# Patient Record
Sex: Female | Born: 1965 | Race: White | Hispanic: No | Marital: Married | Smoking: Never smoker
Health system: Southern US, Community
[De-identification: ages and names within clinical notes are randomized; demographics above are authoritative.]

## PROBLEM LIST (undated history)

## (undated) DIAGNOSIS — E7800 Pure hypercholesterolemia, unspecified: Secondary | ICD-10-CM

## (undated) DIAGNOSIS — K219 Gastro-esophageal reflux disease without esophagitis: Secondary | ICD-10-CM

## (undated) DIAGNOSIS — F329 Major depressive disorder, single episode, unspecified: Secondary | ICD-10-CM

## (undated) DIAGNOSIS — I10 Essential (primary) hypertension: Secondary | ICD-10-CM

## (undated) DIAGNOSIS — E039 Hypothyroidism, unspecified: Secondary | ICD-10-CM

## (undated) DIAGNOSIS — G43909 Migraine, unspecified, not intractable, without status migrainosus: Secondary | ICD-10-CM

## (undated) DIAGNOSIS — F32A Depression, unspecified: Secondary | ICD-10-CM

## (undated) DIAGNOSIS — J45909 Unspecified asthma, uncomplicated: Secondary | ICD-10-CM

## (undated) HISTORY — PX: CHOLECYSTECTOMY: SHX55

## (undated) HISTORY — DX: Hypothyroidism, unspecified: E03.9

## (undated) HISTORY — PX: PARTIAL HYSTERECTOMY: SHX80

## (undated) HISTORY — DX: Migraine, unspecified, not intractable, without status migrainosus: G43.909

## (undated) HISTORY — DX: Pure hypercholesterolemia, unspecified: E78.00

---
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
## 2013-09-14 HISTORY — PX: CATARACT EXTRACTION: SUR2

## 2016-10-15 HISTORY — PX: KNEE SURGERY: SHX244

## 2017-08-23 ENCOUNTER — Telehealth: Payer: Self-pay | Admitting: *Deleted

## 2017-08-23 NOTE — Telephone Encounter (Signed)
Called and spoke with the patient. I informed her that the office will be closed tomorrow 12/11 due to the snow and that we will call back ASAP to reschedule the patient. She verbalized understanding.

## 2017-08-24 ENCOUNTER — Ambulatory Visit: Payer: PRIVATE HEALTH INSURANCE | Admitting: Neurology

## 2017-08-25 NOTE — Telephone Encounter (Signed)
Unable to reach patient regarding an appt r/s

## 2017-08-26 NOTE — Telephone Encounter (Signed)
Tried to call to get patient r/s. LVM asking for call back.

## 2017-08-31 ENCOUNTER — Encounter: Payer: Self-pay | Admitting: Diagnostic Neuroimaging

## 2017-08-31 ENCOUNTER — Ambulatory Visit: Payer: PRIVATE HEALTH INSURANCE | Admitting: Diagnostic Neuroimaging

## 2017-08-31 VITALS — BP 134/86 | HR 84 | Ht 65.0 in | Wt 240.0 lb

## 2017-08-31 DIAGNOSIS — I6782 Cerebral ischemia: Secondary | ICD-10-CM | POA: Diagnosis not present

## 2017-08-31 DIAGNOSIS — G43109 Migraine with aura, not intractable, without status migrainosus: Secondary | ICD-10-CM | POA: Diagnosis not present

## 2017-08-31 MED ORDER — TOPIRAMATE 50 MG PO TABS: 50.0000 mg | ORAL_TABLET | Freq: Two times a day (BID) | ORAL | 12 refills | Status: DC

## 2017-08-31 MED ORDER — RIZATRIPTAN BENZOATE 10 MG PO TBDP: 10.0000 mg | ORAL_TABLET | ORAL | 11 refills | Status: DC | PRN

## 2017-08-31 NOTE — Telephone Encounter (Signed)
RN noted that pt had been r/s with Dr. Marjory LiesPenumalli 08/31/17.

## 2017-08-31 NOTE — Patient Instructions (Signed)
Thank you for coming to see [place] at [facility]. I hope we have been able to provide you high quality care today.  You may receive a patient satisfaction survey over the next few weeks. We would appreciate your feedback and comments so that we may continue to improve ourselves and the health of our patients.   MIGRAINE WITH AURA - start topiramate '50mg'$  at bedtime; after 1 week increase to twice a day; drink plenty of water - rizatriptan '10mg'$  as needed for breakthrough headache; may repeat x 1 after 2 hours; max 2 tabs per day or 8 per month - ok to use tylenol, ibuprofen or excedrin migraine as needed  Abnormal MRI brain (likely chronic small vessel ischemic disease) - continue crestor   ~~~~~~~~~~~~~~~~~~~~~~~~~~~~~~~~~~~~~~~~~~~~~~~~~~~~~~~~~~~~~~~~~  DR. Cierria Height'S GUIDE TO HAPPY AND HEALTHY LIVING These are some of my general health and wellness recommendations. Some of them may apply to you better than others. Please use common sense as you try these suggestions and feel free to ask me any questions.   ACTIVITY/FITNESS Mental, social, emotional and physical stimulation are very important for brain and body health. Try learning a new activity (arts, music, language, sports, games).  Keep moving your body to the best of your abilities. You can do this at home, inside or outside, the park, community center, gym or anywhere you like. Consider a physical therapist or personal trainer to get started. Consider the app Sworkit. Fitness trackers such as smart-watches, smart-phones or Fitbits can help as well.   NUTRITION Eat more plants: colorful vegetables, nuts, seeds and berries.  Eat less sugar, salt, preservatives and processed foods.  Avoid toxins such as cigarettes and alcohol.  Drink water when you are thirsty. Warm water with a slice of lemon is an excellent morning drink to start the day.  Consider these websites for more information The Nutrition Source  (https://www.henry-hernandez.biz/) Precision Nutrition (WindowBlog.ch)   RELAXATION Consider practicing mindfulness meditation or other relaxation techniques such as deep breathing, prayer, yoga, tai chi, massage. See website mindful.org or the apps Headspace or Calm to help get started.   SLEEP Try to get at least 7-8+ hours sleep per day. Regular exercise and reduced caffeine will help you sleep better. Practice good sleep hygeine techniques. See website sleep.org for more information.   PLANNING Prepare estate planning, living will, healthcare POA documents. Sometimes this is best planned with the help of an attorney. Theconversationproject.org and agingwithdignity.org are excellent resources.

## 2017-08-31 NOTE — Progress Notes (Signed)
[facility]  PATIENT: Dawn LabellaWanda Cunningham DOB: Apr 13, 1966  REFERRING CLINICIAN: R York, NP HISTORY FROM: patient and chart review REASON FOR VISIT: new consult    HISTORICAL  CHIEF COMPLAINT:  Chief Complaint  Patient presents with  . Headache    rm 7, New Pt, husband- Dawn Cunningham, "migraine HA, abnormal finding on brain MRI"    HISTORY OF PRESENT ILLNESS:   51 year old right-handed female here for evaluation of headaches.  For past 1-2 months patient has had onset of frontal, retro-orbital pressure aching headaches associated with photophobia, nausea, phonophobia.  Sometimes she sees floaters or flashing lights with her headaches.  Patient was diagnosed with possible migraine and given migraine rescue samples by PCP, which patient tried but made her sleepy.  Patient does not remember what medicine this was.  In triggering factors include increased stress levels or skipping meals.  Headaches occur 2-3 times a week and last 1-2 hours at a time.  No prior similar history of migraine.  No family history of migraine.  Due to new onset headache patient had MRI scan of the brain which was completed.  I reviewed imaging results.  MRI report mentions nonspecific T2 hyperintensities raising possibility of perinatal or prenatal insult versus multiple sclerosis.  I reviewed the images myself and do NOT think these represent demyelinating disease.   REVIEW OF SYSTEMS: Full 14 system review of systems performed and negative with exception of: Blurred vision headache insomnia depression not enough sleep decreased energy aching muscles allergies consented to be constipation.  ALLERGIES: Allergies  Allergen Reactions  . Erythromycin     intolerance  . Tetanus Toxoids     SOB, resp difficulty  . Penicillins Rash    HOME MEDICATIONS: Outpatient Medications Prior to Visit  Medication Sig Dispense Refill  . Calcium-Magnesium-Vitamin D (CALCIUM 1200+D3 PO) Take 2 capsules by mouth  daily.    . cyanocobalamin 1000 MCG tablet Take 1,000 mcg by mouth daily.    . DULoxetine (CYMBALTA) 60 MG capsule Take 120 mg by mouth daily.    Marland Kitchen. LEVOTHYROXINE SODIUM PO Take 37.5 mcg by mouth daily.    . niacin 500 MG tablet Take 500 mg by mouth at bedtime.    . Omega-3 Fatty Acids (FISH OIL PO) Take 1,200 mg by mouth daily.    . pantoprazole (PROTONIX) 40 MG tablet Take 40 mg by mouth daily.    . rosuvastatin (CRESTOR) 40 MG tablet Take 40 mg by mouth daily.     No facility-administered medications prior to visit.     PAST MEDICAL HISTORY: Past Medical History:  Diagnosis Date  . Hypercholesterolemia   . Hypothyroid   . Migraine     PAST SURGICAL HISTORY: Past Surgical History:  Procedure Laterality Date  . CATARACT EXTRACTION Bilateral 2015  . CHOLECYSTECTOMY    . KNEE SURGERY  10/2016   scope  . PARTIAL HYSTERECTOMY     age 51    FAMILY HISTORY: Family History  Problem Relation Age of Onset  . Cancer Mother        brain  . Atrial fibrillation Mother   . Hypertension Father   . Cancer Father        renal  . Other Father        enlarged heart  . Stroke Sister   . Hypertension Sister     SOCIAL HISTORY:  Social History   Socioeconomic History  . Marital status: Married    Spouse name: Not on file  . Number  of children: Not on file  . Years of education: Not on file  . Highest education level: Not on file  Social Needs  . Financial resource strain: Not on file  . Food insecurity - worry: Not on file  . Food insecurity - inability: Not on file  . Transportation needs - medical: Not on file  . Transportation needs - non-medical: Not on file  Occupational History  . Not on file  Tobacco Use  . Smoking status: Never Smoker  . Smokeless tobacco: Never Used  Substance and Sexual Activity  . Alcohol use: No    Frequency: Never  . Drug use: No  . Sexual activity: Not on file  Other Topics Concern  . Not on file  Social History Narrative   Lives  with husband   Caffeine - 1-2 weekly   2 children   RN , [employer]     PHYSICAL EXAM  GENERAL EXAM/CONSTITUTIONAL: Vitals:  Vitals:   08/31/17 1351  BP: 134/86  Pulse: 84  Weight: 240 lb (108.9 kg)  Height: 5\' 5"  (1.651 m)     Body mass index is 39.94 kg/m.  Visual Acuity Screening   Right eye Left eye Both eyes  Without correction: 20/30 20/40   With correction:        Patient is in no distress; well developed, nourished and groomed; neck is supple  CARDIOVASCULAR:  Examination of carotid arteries is normal; no carotid bruits  Regular rate and rhythm, no murmurs  Examination of peripheral vascular system by observation and palpation is normal  EYES:  Ophthalmoscopic exam of optic discs and posterior segments is normal; no papilledema or hemorrhages  MUSCULOSKELETAL:  Gait, strength, tone, movements noted in Neurologic exam below  NEUROLOGIC: MENTAL STATUS:  No flowsheet data found.  awake, alert, oriented to person, place and time  recent and remote memory intact  normal attention and concentration  language fluent, comprehension intact, naming intact,   fund of knowledge appropriate  CRANIAL NERVE:   2nd - no papilledema on fundoscopic exam  2nd, 3rd, 4th, 6th - pupils equal and reactive to light, visual fields full to confrontation, extraocular muscles intact, no nystagmus  5th - facial sensation symmetric  7th - facial strength symmetric  8th - hearing intact  9th - palate elevates symmetrically, uvula midline  11th - shoulder shrug symmetric  12th - tongue protrusion midline  MOTOR:   normal bulk and tone, full strength in the BUE, BLE  SENSORY:   normal and symmetric to light touch, temperature, vibration  COORDINATION:   finger-nose-finger, fine finger movements normal  REFLEXES:   deep tendon reflexes present and symmetric  GAIT/STATION:   narrow based gait; UNSTEADY TANDEM; LIMITED DUE TO LEFT KNEE  PAIN    DIAGNOSTIC DATA (LABS, IMAGING, TESTING) - I reviewed patient records, labs, notes, testing and imaging myself where available.  No results found for: WBC, HGB, HCT, MCV, PLT No results found for: NA, K, CL, CO2, GLUCOSE, BUN, CREATININE, CALCIUM, PROT, ALBUMIN, AST, ALT, ALKPHOS, BILITOT, GFRNONAA, GFRAA No results found for: CHOL, HDL, LDLCALC, LDLDIRECT, TRIG, CHOLHDL No results found for: ZOXW9U No results found for: VITAMINB12 No results found for: TSH   06/04/17 MRI brain [I reviewed images myself. -[facility]]  - no acute findings - non-specific T2 hyperintensities; likely chronic small vessel ischemic disease; not likely multiple sclerosis as indicated in  report    ASSESSMENT AND PLAN  51 y.o. year old female here with new onset headaches  with migraine features starting in October 2018.  Neurologic examination unremarkable.  MRI scan was reviewed, likely related to chronic small vessel ischemic disease and not related to her headaches.  I would recommend to continue with migraine treatment options.  Dx:  1. Migraine with aura and without status migrainosus, not intractable   2. Subcortical microvascular ischemic occlusive disease      PLAN:  MIGRAINE WITH AURA - start topiramate 50mg  at bedtime; after 1 week increase to twice a day; drink plenty of water - rizatriptan 10mg  as needed for breakthrough headache; may repeat x 1 after 2 hours; max 2 tabs per day or 8 per month  Abnormal MRI brain (likely chronic small vessel ischemic disease; not likely multiple sclerosis ) - continue crestor  Meds ordered this encounter  Medications  . topiramate (TOPAMAX) 50 MG tablet    Sig: Take 1 tablet (50 mg total) by mouth 2 (two) times daily.    Dispense:  60 tablet    Refill:  12  . rizatriptan (MAXALT-MLT) 10 MG disintegrating tablet    Sig: Take 1 tablet (10 mg total) by mouth as needed for migraine. May repeat in 2 hours if needed    Dispense:  9 tablet    Refill:   11   Return in about 6 months (around 03/01/2018) for with NP.    Suanne MarkerVIKRAM R. PENUMALLI, MD 08/31/2017, 2:19 PM Certified in Neurology, Neurophysiology and Neuroimaging  [facility] [address], [address] [place], [place] [address] [phone]

## 2018-03-02 ENCOUNTER — Ambulatory Visit: Payer: PRIVATE HEALTH INSURANCE | Admitting: Adult Health

## 2019-02-16 ENCOUNTER — Emergency Department (HOSPITAL_COMMUNITY): Payer: No Typology Code available for payment source

## 2019-02-16 ENCOUNTER — Encounter (HOSPITAL_COMMUNITY): Payer: Self-pay | Admitting: Emergency Medicine

## 2019-02-16 ENCOUNTER — Other Ambulatory Visit: Payer: Self-pay

## 2019-02-16 ENCOUNTER — Emergency Department (HOSPITAL_COMMUNITY)
Admission: EM | Admit: 2019-02-16 | Discharge: 2019-02-16 | Disposition: A | Discharge status: Home / Self Care | Payer: No Typology Code available for payment source | Attending: Emergency Medicine | Admitting: Emergency Medicine

## 2019-02-16 DIAGNOSIS — Y990 Civilian activity done for income or pay: Secondary | ICD-10-CM | POA: Insufficient documentation

## 2019-02-16 DIAGNOSIS — I10 Essential (primary) hypertension: Secondary | ICD-10-CM | POA: Insufficient documentation

## 2019-02-16 DIAGNOSIS — E039 Hypothyroidism, unspecified: Secondary | ICD-10-CM | POA: Insufficient documentation

## 2019-02-16 DIAGNOSIS — W1830XA Fall on same level, unspecified, initial encounter: Secondary | ICD-10-CM | POA: Insufficient documentation

## 2019-02-16 DIAGNOSIS — Z79899 Other long term (current) drug therapy: Secondary | ICD-10-CM | POA: Insufficient documentation

## 2019-02-16 DIAGNOSIS — S53104A Unspecified dislocation of right ulnohumeral joint, initial encounter: Secondary | ICD-10-CM | POA: Diagnosis not present

## 2019-02-16 DIAGNOSIS — S59901A Unspecified injury of right elbow, initial encounter: Secondary | ICD-10-CM | POA: Diagnosis present

## 2019-02-16 DIAGNOSIS — Y92129 Unspecified place in nursing home as the place of occurrence of the external cause: Secondary | ICD-10-CM | POA: Diagnosis not present

## 2019-02-16 DIAGNOSIS — Y9301 Activity, walking, marching and hiking: Secondary | ICD-10-CM | POA: Diagnosis not present

## 2019-02-16 HISTORY — DX: Essential (primary) hypertension: I10

## 2019-02-16 HISTORY — DX: Gastro-esophageal reflux disease without esophagitis: K21.9

## 2019-02-16 HISTORY — DX: Depression, unspecified: F32.A

## 2019-02-16 HISTORY — DX: Unspecified asthma, uncomplicated: J45.909

## 2019-02-16 MED ORDER — HYDROMORPHONE HCL 1 MG/ML IJ SOLN
0.5000 mg | Freq: Once | INTRAMUSCULAR | Status: AC
  Administered 2019-02-16: 0.5 mg via INTRAVENOUS
  Filled 2019-02-16: qty 1

## 2019-02-16 MED ORDER — HYDROCODONE-ACETAMINOPHEN 5-325 MG PO TABS: 1.0000 | ORAL_TABLET | Freq: Four times a day (QID) | ORAL | 0 refills | Status: DC | PRN

## 2019-02-16 MED ORDER — ETOMIDATE 2 MG/ML IV SOLN
INTRAVENOUS | Status: AC | PRN
  Administered 2019-02-16: 10 mg via INTRAVENOUS

## 2019-02-16 MED ORDER — ETOMIDATE 2 MG/ML IV SOLN
INTRAVENOUS | Status: AC | PRN
  Administered 2019-02-16: 5 mg via INTRAVENOUS

## 2019-02-16 MED ORDER — ETOMIDATE 2 MG/ML IV SOLN
10.0000 mg | Freq: Once | INTRAVENOUS | Status: AC
  Administered 2019-02-16: 21:00:00 10 mg via INTRAVENOUS
  Filled 2019-02-16: qty 10

## 2019-02-16 NOTE — ED Provider Notes (Signed)
[facility] Provider Note   CSN: [id number] Arrival date & time: 02/16/19  1734   History   Chief Complaint Chief Complaint  Patient presents with  . Arm Injury    HPI Cherlyn LabellaWanda Lewey is a 53 y.o. female.    HPI   53 year old female presents status post fall.  She notes she had a mechanical fall today landing on an outstretched left arm.  She notes pain at her left elbow with obvious deformity.  She denies any loss of sensation or strength in the hand.  She notes some minor pain at her wrist, but no significant tenderness.  She denies any pain at the shoulder or any other injuries.  She notes her last solid intake was at 1 PM, she did have several sips of water approximately 45 minutes prior to arrival.  She received 100 mics of fentanyl prior to arrival.    Past Medical History:  Diagnosis Date  . Asthma   . Depression   . GERD (gastroesophageal reflux disease)   . Hypercholesterolemia   . Hypertension   . Hypothyroid   . Migraine     There are no active problems to display for this patient.   Past Surgical History:  Procedure Laterality Date  . CATARACT EXTRACTION Bilateral 2015  . CHOLECYSTECTOMY    . KNEE SURGERY  10/2016   scope  . PARTIAL HYSTERECTOMY     age 53     OB History   No obstetric history on file.      Home Medications    Prior to Admission medications   Medication Sig Start Date End Date Taking? Authorizing Provider  Calcium-Magnesium-Vitamin D (CALCIUM 1200+D3 PO) Take 2 capsules by mouth daily.   Yes [provider]  cyanocobalamin 1000 MCG tablet Take 1,000 mcg by mouth daily.   Yes [provider]  DULoxetine (CYMBALTA) 60 MG capsule Take 120 mg by mouth daily.   Yes [provider]  levothyroxine (SYNTHROID) 75 MCG tablet Take 37.5 mcg by mouth daily.    Yes [provider]  losartan (COZAAR) 100 MG tablet Take 100 mg by mouth at bedtime.   Yes [provider]  magnesium gluconate (MAGONATE) 500 MG tablet Take 500 mg by mouth daily.   Yes [provider]  niacin 500 MG tablet Take 500 mg by mouth at bedtime.   Yes [provider]  Omega-3 Fatty Acids (FISH OIL PO) Take 1,200 mg by mouth daily.   Yes [provider]  pantoprazole (PROTONIX) 40 MG tablet Take 40 mg by mouth daily.   Yes [provider]  rosuvastatin (CRESTOR) 40 MG tablet Take 40 mg by mouth at bedtime.    Yes [provider]  zinc gluconate 50 MG tablet Take 50 mg by mouth daily.   Yes [provider]    Family History Family History  Problem Relation Age of Onset  . Cancer Mother        brain  . Atrial fibrillation Mother   . Hypertension Father   . Cancer Father        renal  . Other Father        enlarged heart  . Stroke Sister   . Hypertension Sister     Social History Social History   Tobacco Use  . Smoking status: Never Smoker  . Smokeless tobacco: Never Used  Substance Use Topics  . Alcohol use: No    Frequency: Never  .  Drug use: No     Allergies   Erythromycin; Tetanus toxoids; and Penicillins   Review of Systems Review of Systems  All other systems reviewed and are negative.   Physical Exam Updated Vital Signs BP (!) 145/96 (BP Location: Right Arm)   Pulse 99   Temp 99.3 F (37.4 C) (Oral)   Resp (!) 22   Ht 5\' 5"  (1.651 m)   Wt 111.6 kg   SpO2 93%   BMI 40.94 kg/m   Physical Exam Vitals signs and nursing note reviewed.  Constitutional:      Appearance: She is well-developed.  HENT:     Head: Normocephalic and atraumatic.  Eyes:     General: No scleral icterus.       Right eye: No discharge.        Left eye: No discharge.     Conjunctiva/sclera: Conjunctivae normal.     Pupils: Pupils are equal, round, and reactive to light.  Neck:     Musculoskeletal: Normal range of motion.     Vascular: No JVD.     Trachea: No tracheal deviation.  Pulmonary:      Effort: Pulmonary effort is normal.     Breath sounds: No stridor.  Musculoskeletal:     Comments: Obvious deformity at the left elbow-tenderness throughout, no open wounds-no significant pain to palpation of the remainder of the upper extremity and shoulder wrist or hand-radial pulse 2+, pulse motor and sensory intact, no signs of ulnar radial medial nerve damage  Neurological:     Mental Status: She is alert and oriented to person, place, and time.     Coordination: Coordination normal.  Psychiatric:        Behavior: Behavior normal.        Thought Content: Thought content normal.        Judgment: Judgment normal.     ED Treatments / Results  Labs (all labs ordered are listed, but only abnormal results are displayed) Labs Reviewed - No data to display  EKG None  Radiology No results found.  Procedures Procedures (including critical care time)  Medications Ordered in ED Medications  etomidate (AMIDATE) injection 10 mg (has no administration in time range)  HYDROmorphone (DILAUDID) injection 0.5 mg (0.5 mg Intravenous Given 02/16/19 1752)     Initial Impression / Assessment and Plan / ED Course  I have reviewed the triage vital signs and the nursing notes.  Pertinent labs & imaging results that were available during my care of the patient were reviewed by me and considered in my medical decision making (see chart for details).         Imaging: Elbow complete left, DG forearm left   Assessment/Plan: 53 year old female presents status post fall.  She fell onto an outstretched hand with a obvious dislocation at her left elbow.  She has no obvious vascular compromise, no nerve compromise at this time.  Patient will need reduction at bedside.  Care will be shared with oncoming provider including reduction and disposition.   Final Clinical Impressions(s) / ED Diagnoses   Final diagnoses:  Dislocation of right elbow, initial encounter    ED Discharge Orders    None        Rosalio Loud 02/16/19 Teodoro Spray, MD 02/17/19 1011

## 2019-02-16 NOTE — ED Provider Notes (Signed)
Signout from Newell RubbermaidJeffrey Hedges, PA-C at shift change See previous providers note for full H&P  Briefly, patient presenting with elbow dislocation after tripping and falling on outstretched hand.  Official x-rays are pending at shift change.  Plan to reduce under conscious sedation.   Physical Exam  BP 135/89   Pulse 82   Temp 99.3 F (37.4 C) (Oral)   Resp 18   Ht 5\' 5"  (1.651 m)   Wt 111.6 kg   SpO2 94%   BMI 40.94 kg/m   Physical Exam Vitals signs and nursing note reviewed.  Constitutional:      General: She is not in acute distress.    Appearance: She is well-developed. She is not diaphoretic.  HENT:     Head: Normocephalic and atraumatic.     Mouth/Throat:     Pharynx: No oropharyngeal exudate.  Eyes:     General: No scleral icterus.       Right eye: No discharge.        Left eye: No discharge.     Conjunctiva/sclera: Conjunctivae normal.     Pupils: Pupils are equal, round, and reactive to light.  Neck:     Musculoskeletal: Normal range of motion and neck supple.     Thyroid: No thyromegaly.  Cardiovascular:     Rate and Rhythm: Normal rate and regular rhythm.     Heart sounds: Normal heart sounds. No murmur. No friction rub. No gallop.   Pulmonary:     Effort: Pulmonary effort is normal. No respiratory distress.     Breath sounds: Normal breath sounds. No stridor. No wheezing or rales.  Abdominal:     General: Bowel sounds are normal. There is no distension.     Palpations: Abdomen is soft.     Tenderness: There is no abdominal tenderness. There is no guarding or rebound.  Musculoskeletal:     Comments: Deformity noted to left elbow with tenderness; tenderness at the left wrist and left small finger as well; blood blister noted on the palmar aspect of the left finger with a PIP; some ecchymosis ending on the palmar, ulnar aspect of the hand as well; radial pulse intact, sensation intact  Lymphadenopathy:     Cervical: No cervical adenopathy.  Skin:    General:  Skin is warm and dry.     Coloration: Skin is not pale.     Findings: No rash.  Neurological:     Mental Status: She is alert.     Coordination: Coordination normal.     ED Course/Procedures     Reduction of dislocation Date/Time: 02/16/2019 10:42 PM Performed by: Emi HolesLaw, Julieanna Geraci M, PA-C Authorized by: Laurence SpatesLittle, Rachel Morgan, MD  Consent: Verbal consent obtained. Written consent obtained. Risks and benefits: risks, benefits and alternatives were discussed Consent given by: patient Patient understanding: patient states understanding of the procedure being performed Patient consent: the patient's understanding of the procedure matches consent given Procedure consent: procedure consent matches procedure scheduled Relevant documents: relevant documents present and verified Test results: test results available and properly labeled Site marked: the operative site was marked Imaging studies: imaging studies available Patient identity confirmed: verbally with patient Time out: Immediately prior to procedure a "time out" was called to verify the correct patient, procedure, equipment, support staff and site/side marked as required. Local anesthesia used: no  Anesthesia: Local anesthesia used: no  Sedation: Patient sedated: yes Sedation type: moderate (conscious) sedation Sedatives: etomidate Analgesia: hydromorphone Sedation start date/time: 02/16/2019 8:57 PM Sedation end date/time: 02/16/2019  9:10 PM Vitals: Vital signs were monitored during sedation.  Patient tolerance: Patient tolerated the procedure well with no immediate complications Comments: See Dr. Fredirick Maudlin more detailed procedure note for sedation that she conducted   SPLINT APPLICATION Date/Time: 11:25 PM Authorized by: Emi Holes Consent: Verbal consent obtained. Risks and benefits: risks, benefits and alternatives were discussed Consent given by: patient Splint applied by: orthopedic technician Location details:  Left elbow Splint type: Posterior Supplies used: Ortho-Glass, Ace wrap, arm sling Post-procedure: The splinted body part was neurovascularly unchanged following the procedure. Patient tolerance: Patient tolerated the procedure well with no immediate complications.     MDM  I spoke with orthopedist on-call, Dr. Jena Gauss, who advised posterior splint and follow-up in the office next week.  Patient with left elbow dislocation which was reduced under conscious sedation with my attending, Dr. Clarene Duke.  See her note for sedation.  Post reduction films show status post reduction of previous elbow dislocation with significantly improved osseous alignment; large joint effusion; multiple small osseous fragments again noted suspicion for associated acute fractures, especially of the coronoid process.  No fracture seen in the hand, although study limited due to splinting.  Digit is immobilized with splint anyway.  Patient to follow-up with Dr. Jena Gauss.  Patient placed in splint and arm sling.  Splint care discussed.  Will discharge home with short course of Norco and ibuprofen.  Return precautions discussed.  Patient understands and agrees with plan.  Patient vitals stable throughout ED course and discharged in satisfactory condition.       Emi Holes, PA-C 02/16/19 2325    Little, Ambrose Finland, MD 02/17/19 [phone]

## 2019-02-16 NOTE — ED Provider Notes (Signed)
.  Sedation Date/Time: 02/16/2019 10:10 PM Performed by: Laurence Spates, MD Authorized by: Laurence Spates, MD   Consent:    Consent obtained:  Written   Consent given by:  Patient   Risks discussed:  Inadequate sedation, nausea, prolonged hypoxia resulting in organ damage, prolonged sedation necessitating reversal, respiratory compromise necessitating ventilatory assistance and intubation and vomiting   Alternatives discussed:  Analgesia without sedation Universal protocol:    Immediately prior to procedure a time out was called: yes     Patient identity confirmation method:  Verbally with patient Indications:    Procedure performed:  Dislocation reduction   Procedure necessitating sedation performed by:  Different physician Pre-sedation assessment:    Time since last food or drink:  4 hours   NPO status caution: urgency dictates proceeding with non-ideal NPO status     ASA classification: class 2 - patient with mild systemic disease     Neck mobility: normal     Mouth opening:  3 or more finger widths   Mallampati score:  III - soft palate, base of uvula visible   Pre-sedation assessments completed and reviewed: airway patency, cardiovascular function, mental status, pain level and respiratory function   Immediate pre-procedure details:    Reassessment: Patient reassessed immediately prior to procedure     Reviewed: vital signs, relevant labs/tests and NPO status     Verified: bag valve mask available, emergency equipment available, intubation equipment available, IV patency confirmed, oxygen available and suction available   Procedure details (see MAR for exact dosages):    Preoxygenation:  Nasal cannula   Sedation:  Etomidate   Intra-procedure monitoring:  Blood pressure monitoring, cardiac monitor, continuous capnometry, continuous pulse oximetry, frequent LOC assessments and frequent vital sign checks   Intra-procedure events: none     Total Provider sedation time  (minutes):  15 Post-procedure details:    Attendance: Constant attendance by certified staff until patient recovered     Recovery: Patient returned to pre-procedure baseline     Post-sedation assessments completed and reviewed: airway patency, cardiovascular function, hydration status, mental status, nausea/vomiting, pain level and respiratory function     Patient is stable for discharge or admission: yes     Patient tolerance:  Tolerated well, no immediate complications      Labarron Durnin, Ambrose Finland, MD 02/16/19 2213

## 2019-02-16 NOTE — Discharge Instructions (Addendum)
For severe pain, take 1-2 Vicodin every 6 hours.  You can alternate with ibuprofen as prescribed over-the-counter.  Please follow-up with Dr. Jena Gauss next week for further evaluation and treatment.  Keep your splint in place at all times.  Do not get it wet.  Please return to the emergency department develop any new or worsening symptoms.  Do not drink alcohol, drive, operate machinery or participate in any other potentially dangerous activities while taking opiate pain medication as it may make you sleepy. Do not take this medication with any other sedating medications, either prescription or over-the-counter. If you were prescribed Percocet or Vicodin, do not take these with acetaminophen (Tylenol) as it is already contained within these medications and overdose of Tylenol is dangerous.   This medication is an opiate (or narcotic) pain medication and can be habit forming.  Use it as little as possible to achieve adequate pain control.  Do not use or use it with extreme caution if you have a history of opiate abuse or dependence. This medication is intended for your use only - do not give any to anyone else and keep it in a secure place where nobody else, especially children, have access to it. It will also cause or worsen constipation, so you may want to consider taking an over-the-counter stool softener while you are taking this medication.

## 2019-02-16 NOTE — ED Triage Notes (Signed)
Pt arrives to ED from work with complaints of a fall that caused a left arm deformity to the elbow. 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 Pt tested negative for COVID yesterday. Pt last ate BBQ at 1pm and last drank water at 430pm.

## 2019-02-21 ENCOUNTER — Ambulatory Visit (HOSPITAL_COMMUNITY): Payer: No Typology Code available for payment source

## 2019-02-21 ENCOUNTER — Encounter (HOSPITAL_COMMUNITY)
Admission: RE | Disposition: A | Discharge status: Home / Self Care | Payer: Self-pay | Source: Ambulatory Visit | Attending: Orthopedic Surgery

## 2019-02-21 ENCOUNTER — Ambulatory Visit (HOSPITAL_COMMUNITY): Payer: No Typology Code available for payment source | Admitting: Certified Registered"

## 2019-02-21 ENCOUNTER — Ambulatory Visit (HOSPITAL_COMMUNITY)
Admission: RE | Admit: 2019-02-21 | Discharge: 2019-02-21 | Disposition: A | Discharge status: Home / Self Care | Payer: No Typology Code available for payment source | Source: Ambulatory Visit | Attending: Orthopedic Surgery | Admitting: Orthopedic Surgery

## 2019-02-21 ENCOUNTER — Other Ambulatory Visit: Payer: Self-pay

## 2019-02-21 ENCOUNTER — Other Ambulatory Visit (HOSPITAL_COMMUNITY)
Admission: RE | Admit: 2019-02-21 | Discharge: 2019-02-21 | Disposition: A | Discharge status: Home / Self Care | Payer: No Typology Code available for payment source | Source: Ambulatory Visit | Attending: Orthopedic Surgery | Admitting: Orthopedic Surgery

## 2019-02-21 ENCOUNTER — Ambulatory Visit: Payer: Self-pay | Admitting: Orthopedic Surgery

## 2019-02-21 ENCOUNTER — Encounter (HOSPITAL_COMMUNITY): Payer: Self-pay

## 2019-02-21 DIAGNOSIS — F329 Major depressive disorder, single episode, unspecified: Secondary | ICD-10-CM | POA: Diagnosis not present

## 2019-02-21 DIAGNOSIS — J45909 Unspecified asthma, uncomplicated: Secondary | ICD-10-CM | POA: Diagnosis not present

## 2019-02-21 DIAGNOSIS — S53105A Unspecified dislocation of left ulnohumeral joint, initial encounter: Secondary | ICD-10-CM | POA: Diagnosis present

## 2019-02-21 DIAGNOSIS — I10 Essential (primary) hypertension: Secondary | ICD-10-CM | POA: Diagnosis not present

## 2019-02-21 DIAGNOSIS — W19XXXA Unspecified fall, initial encounter: Secondary | ICD-10-CM | POA: Insufficient documentation

## 2019-02-21 DIAGNOSIS — Z6841 Body Mass Index (BMI) 40.0 and over, adult: Secondary | ICD-10-CM | POA: Insufficient documentation

## 2019-02-21 DIAGNOSIS — S53115A Anterior dislocation of left ulnohumeral joint, initial encounter: Secondary | ICD-10-CM

## 2019-02-21 DIAGNOSIS — Z79899 Other long term (current) drug therapy: Secondary | ICD-10-CM | POA: Insufficient documentation

## 2019-02-21 DIAGNOSIS — E039 Hypothyroidism, unspecified: Secondary | ICD-10-CM | POA: Insufficient documentation

## 2019-02-21 DIAGNOSIS — Z419 Encounter for procedure for purposes other than remedying health state, unspecified: Secondary | ICD-10-CM

## 2019-02-21 DIAGNOSIS — K219 Gastro-esophageal reflux disease without esophagitis: Secondary | ICD-10-CM | POA: Diagnosis not present

## 2019-02-21 DIAGNOSIS — Y990 Civilian activity done for income or pay: Secondary | ICD-10-CM | POA: Diagnosis not present

## 2019-02-21 DIAGNOSIS — S53125A Posterior dislocation of left ulnohumeral joint, initial encounter: Secondary | ICD-10-CM | POA: Insufficient documentation

## 2019-02-21 DIAGNOSIS — E7800 Pure hypercholesterolemia, unspecified: Secondary | ICD-10-CM | POA: Diagnosis not present

## 2019-02-21 DIAGNOSIS — Z79890 Hormone replacement therapy (postmenopausal): Secondary | ICD-10-CM | POA: Insufficient documentation

## 2019-02-21 DIAGNOSIS — Z1159 Encounter for screening for other viral diseases: Secondary | ICD-10-CM | POA: Diagnosis not present

## 2019-02-21 DIAGNOSIS — Z880 Allergy status to penicillin: Secondary | ICD-10-CM | POA: Diagnosis not present

## 2019-02-21 DIAGNOSIS — T148XXA Other injury of unspecified body region, initial encounter: Secondary | ICD-10-CM

## 2019-02-21 DIAGNOSIS — Z881 Allergy status to other antibiotic agents status: Secondary | ICD-10-CM | POA: Insufficient documentation

## 2019-02-21 HISTORY — PX: CLOSED REDUCTION ELBOW FRACTURE: SHX930

## 2019-02-21 HISTORY — PX: EXTERNAL FIXATION ARM: SHX1552

## 2019-02-21 LAB — BASIC METABOLIC PANEL
Anion gap: 13 (ref 5–15)
BUN: 9 mg/dL (ref 6–20)
CO2: 21 mmol/L — ABNORMAL LOW (ref 22–32)
Calcium: 9.3 mg/dL (ref 8.9–10.3)
Chloride: 106 mmol/L (ref 98–111)
Creatinine, Ser: 0.72 mg/dL (ref 0.44–1.00)
GFR calc Af Amer: >60 mL/min (ref >60)
GFR calc non Af Amer: >60 mL/min (ref >60)
Glucose, Bld: 103 mg/dL — ABNORMAL HIGH (ref 70–99)
Potassium: 3.9 mmol/L (ref 3.5–5.1)
Sodium: 140 mmol/L (ref 135–145)

## 2019-02-21 LAB — CBC
HCT: 42.1 % (ref 36.0–46.0)
Hemoglobin: 13.3 g/dL (ref 12.0–15.0)
MCH: 29.9 pg (ref 26.0–34.0)
MCHC: 31.6 g/dL (ref 30.0–36.0)
MCV: 94.6 fL (ref 80.0–100.0)
Platelets: 416 10*3/uL — ABNORMAL HIGH (ref 150–400)
RBC: 4.45 MIL/uL (ref 3.87–5.11)
RDW: 12.2 % (ref 11.5–15.5)
WBC: 8.3 10*3/uL (ref 4.0–10.5)
nRBC: 0 % (ref 0.0–0.2)

## 2019-02-21 LAB — SARS CORONAVIRUS 2 BY RT PCR (HOSPITAL ORDER, PERFORMED IN ~~LOC~~ HOSPITAL LAB): SARS Coronavirus 2: NEGATIVE

## 2019-02-21 SURGERY — CLOSED REDUCTION, ELBOW
Anesthesia: General | Site: Elbow | Laterality: Left

## 2019-02-21 MED ORDER — CLINDAMYCIN PHOSPHATE 900 MG/50ML IV SOLN
900.0000 mg | INTRAVENOUS | Status: AC
  Administered 2019-02-21: 900 mg via INTRAVENOUS

## 2019-02-21 MED ORDER — DEXAMETHASONE SODIUM PHOSPHATE 10 MG/ML IJ SOLN
INTRAMUSCULAR | Status: DC | PRN
  Administered 2019-02-21: 5 mg via INTRAVENOUS

## 2019-02-21 MED ORDER — GABAPENTIN 300 MG PO CAPS
ORAL_CAPSULE | ORAL | Status: AC
  Filled 2019-02-21: qty 1

## 2019-02-21 MED ORDER — PHENYLEPHRINE HCL (PRESSORS) 10 MG/ML IV SOLN
INTRAVENOUS | Status: DC | PRN
  Administered 2019-02-21 (×2): 80 ug via INTRAVENOUS

## 2019-02-21 MED ORDER — HYDROCODONE-ACETAMINOPHEN 5-325 MG PO TABS: 1.0000 | ORAL_TABLET | Freq: Four times a day (QID) | ORAL | 0 refills | Status: DC | PRN

## 2019-02-21 MED ORDER — SUCCINYLCHOLINE CHLORIDE 200 MG/10ML IV SOSY
PREFILLED_SYRINGE | INTRAVENOUS | Status: DC | PRN
  Administered 2019-02-21: 100 mg via INTRAVENOUS

## 2019-02-21 MED ORDER — MIDAZOLAM HCL 2 MG/2ML IJ SOLN
INTRAMUSCULAR | Status: AC
  Filled 2019-02-21: qty 2

## 2019-02-21 MED ORDER — SUGAMMADEX SODIUM 200 MG/2ML IV SOLN
INTRAVENOUS | Status: DC | PRN
  Administered 2019-02-21: 200 mg via INTRAVENOUS

## 2019-02-21 MED ORDER — PROPOFOL 10 MG/ML IV BOLUS
INTRAVENOUS | Status: DC | PRN
  Administered 2019-02-21: 160 mg via INTRAVENOUS

## 2019-02-21 MED ORDER — LACTATED RINGERS IV SOLN
INTRAVENOUS | Status: DC | PRN
  Administered 2019-02-21 (×2): via INTRAVENOUS

## 2019-02-21 MED ORDER — ACETAMINOPHEN 500 MG PO TABS
ORAL_TABLET | ORAL | Status: AC
  Filled 2019-02-21: qty 2

## 2019-02-21 MED ORDER — ONDANSETRON HCL 4 MG/2ML IJ SOLN
INTRAMUSCULAR | Status: DC | PRN
  Administered 2019-02-21: 4 mg via INTRAVENOUS

## 2019-02-21 MED ORDER — FENTANYL CITRATE (PF) 100 MCG/2ML IJ SOLN
25.0000 ug | INTRAMUSCULAR | Status: DC | PRN
  Administered 2019-02-21 (×2): 25 ug via INTRAVENOUS

## 2019-02-21 MED ORDER — FENTANYL CITRATE (PF) 100 MCG/2ML IJ SOLN
INTRAMUSCULAR | Status: DC | PRN
  Administered 2019-02-21: 50 ug via INTRAVENOUS
  Administered 2019-02-21 (×3): 100 ug via INTRAVENOUS

## 2019-02-21 MED ORDER — GABAPENTIN 300 MG PO CAPS
300.0000 mg | ORAL_CAPSULE | Freq: Once | ORAL | Status: AC
  Administered 2019-02-21: 300 mg via ORAL

## 2019-02-21 MED ORDER — ROCURONIUM BROMIDE 100 MG/10ML IV SOLN
INTRAVENOUS | Status: DC | PRN
  Administered 2019-02-21: 50 mg via INTRAVENOUS
  Administered 2019-02-21: 10 mg via INTRAVENOUS

## 2019-02-21 MED ORDER — CLINDAMYCIN PHOSPHATE 900 MG/50ML IV SOLN
INTRAVENOUS | Status: AC
  Filled 2019-02-21: qty 50

## 2019-02-21 MED ORDER — 0.9 % SODIUM CHLORIDE (POUR BTL) OPTIME
TOPICAL | Status: DC | PRN
  Administered 2019-02-21: 18:00:00 1000 mL

## 2019-02-21 MED ORDER — FENTANYL CITRATE (PF) 250 MCG/5ML IJ SOLN
INTRAMUSCULAR | Status: AC
  Filled 2019-02-21: qty 5

## 2019-02-21 MED ORDER — ONDANSETRON HCL 4 MG/2ML IJ SOLN: 4.0000 mg | Freq: Once | INTRAMUSCULAR | Status: DC | PRN

## 2019-02-21 MED ORDER — ACETAMINOPHEN 500 MG PO TABS
1000.0000 mg | ORAL_TABLET | Freq: Once | ORAL | Status: AC
  Administered 2019-02-21: 1000 mg via ORAL

## 2019-02-21 MED ORDER — LIDOCAINE HCL (CARDIAC) PF 100 MG/5ML IV SOSY
PREFILLED_SYRINGE | INTRAVENOUS | Status: DC | PRN
  Administered 2019-02-21: 60 mg via INTRAVENOUS

## 2019-02-21 MED ORDER — FENTANYL CITRATE (PF) 100 MCG/2ML IJ SOLN
INTRAMUSCULAR | Status: AC
  Filled 2019-02-21: qty 2

## 2019-02-21 SURGICAL SUPPLY — 88 items
ANCH SUT 1 SHRT SM RGD INSRTR (Anchor) ×2 IMPLANT
ANCHOR SUT 1.45 SZ 1 SHORT (Anchor) ×3 IMPLANT
APL SKNCLS STERI-STRIP NONHPOA (GAUZE/BANDAGES/DRESSINGS)
BANDAGE ACE 3X5.8 VEL STRL LF (GAUZE/BANDAGES/DRESSINGS) ×4 IMPLANT
BANDAGE ACE 4X5 VEL STRL LF (GAUZE/BANDAGES/DRESSINGS) ×4 IMPLANT
BANDAGE ACE 6X5 VEL STRL LF (GAUZE/BANDAGES/DRESSINGS) ×4 IMPLANT
BAR EXFX 250X11 NS LF (EXFIX) ×2
BAR GLASS FIBER EXFX 11X250 (EXFIX) ×3 IMPLANT
BENZOIN TINCTURE PRP APPL 2/3 (GAUZE/BANDAGES/DRESSINGS) IMPLANT
BLADE CLIPPER SURG (BLADE) ×4 IMPLANT
BLADE SURG 10 STRL SS (BLADE) IMPLANT
BNDG CMPR MD 5X2 ELC HKLP STRL (GAUZE/BANDAGES/DRESSINGS) ×2
BNDG COHESIVE 4X5 TAN STRL (GAUZE/BANDAGES/DRESSINGS) ×7 IMPLANT
BNDG ELASTIC 2 VLCR STRL LF (GAUZE/BANDAGES/DRESSINGS) ×3 IMPLANT
BNDG GAUZE ELAST 4 BULKY (GAUZE/BANDAGES/DRESSINGS) ×4 IMPLANT
BRUSH SCRUB SURG 4.25 DISP (MISCELLANEOUS) ×8 IMPLANT
CLAMP PIN 45MM 1 BAR (EXFIX) ×6 IMPLANT
CLOSURE WOUND 1/2 X4 (GAUZE/BANDAGES/DRESSINGS)
COVER SURGICAL LIGHT HANDLE (MISCELLANEOUS) ×8 IMPLANT
COVER WAND RF STERILE (DRAPES) ×1 IMPLANT
CUFF TOURNIQUET SINGLE 18IN (TOURNIQUET CUFF) IMPLANT
CUFF TOURNIQUET SINGLE 24IN (TOURNIQUET CUFF) IMPLANT
DECANTER SPIKE VIAL GLASS SM (MISCELLANEOUS) IMPLANT
DRAPE C-ARM 42X72 X-RAY (DRAPES) ×3 IMPLANT
DRAPE C-ARMOR (DRAPES) ×4 IMPLANT
DRAPE INCISE IOBAN 66X45 STRL (DRAPES) IMPLANT
DRAPE OEC MINIVIEW 54X84 (DRAPES) ×3 IMPLANT
DRAPE U-SHAPE 47X51 STRL (DRAPES) ×4 IMPLANT
DRSG ADAPTIC 3X8 NADH LF (GAUZE/BANDAGES/DRESSINGS) ×3 IMPLANT
DRSG EMULSION OIL 3X3 NADH (GAUZE/BANDAGES/DRESSINGS) IMPLANT
ELECT REM PT RETURN 9FT ADLT (ELECTROSURGICAL) ×4
ELECTRODE REM PT RTRN 9FT ADLT (ELECTROSURGICAL) ×2 IMPLANT
GAUZE SPONGE 4X4 12PLY STRL (GAUZE/BANDAGES/DRESSINGS) ×4 IMPLANT
GAUZE SPONGE 4X4 12PLY STRL LF (GAUZE/BANDAGES/DRESSINGS) ×4 IMPLANT
GAUZE XEROFORM 1X8 LF (GAUZE/BANDAGES/DRESSINGS) ×4 IMPLANT
GAUZE XEROFORM 5X9 LF (GAUZE/BANDAGES/DRESSINGS) ×4 IMPLANT
GLOVE BIO SURGEON STRL SZ7.5 (GLOVE) ×4 IMPLANT
GLOVE BIO SURGEON STRL SZ8 (GLOVE) ×4 IMPLANT
GLOVE BIOGEL PI IND STRL 7.0 (GLOVE) ×2 IMPLANT
GLOVE BIOGEL PI IND STRL 7.5 (GLOVE) ×2 IMPLANT
GLOVE BIOGEL PI IND STRL 8 (GLOVE) ×2 IMPLANT
GLOVE BIOGEL PI INDICATOR 7.0 (GLOVE) ×4
GLOVE BIOGEL PI INDICATOR 7.5 (GLOVE) ×2
GLOVE BIOGEL PI INDICATOR 8 (GLOVE) ×2
GOWN STRL REUS W/ TWL LRG LVL3 (GOWN DISPOSABLE) ×4 IMPLANT
GOWN STRL REUS W/ TWL XL LVL3 (GOWN DISPOSABLE) ×2 IMPLANT
GOWN STRL REUS W/TWL LRG LVL3 (GOWN DISPOSABLE) ×8
GOWN STRL REUS W/TWL XL LVL3 (GOWN DISPOSABLE) ×4
HALF PIN 4MM (EXFIX) ×6 IMPLANT
KIT BASIN OR (CUSTOM PROCEDURE TRAY) ×4 IMPLANT
KIT TURNOVER KIT B (KITS) ×4 IMPLANT
MANIFOLD NEPTUNE II (INSTRUMENTS) ×4 IMPLANT
NS IRRIG 1000ML POUR BTL (IV SOLUTION) ×4 IMPLANT
PACK ORTHO EXTREMITY (CUSTOM PROCEDURE TRAY) ×4 IMPLANT
PAD ABD 8X10 STRL (GAUZE/BANDAGES/DRESSINGS) ×3 IMPLANT
PAD ARMBOARD 7.5X6 YLW CONV (MISCELLANEOUS) ×8 IMPLANT
PAD CAST 3X4 CTTN HI CHSV (CAST SUPPLIES) ×1 IMPLANT
PAD CAST 4YDX4 CTTN HI CHSV (CAST SUPPLIES) ×3 IMPLANT
PADDING CAST COTTON 3X4 STRL (CAST SUPPLIES) ×4
PADDING CAST COTTON 4X4 STRL (CAST SUPPLIES) ×8
PADDING UNDERCAST 2 STRL (CAST SUPPLIES) ×2
PADDING UNDERCAST 2X4 STRL (CAST SUPPLIES) ×1 IMPLANT
PIN 4MMX120X25 (EXFIX) ×6 IMPLANT
SLING ARM FOAM STRAP XLG (SOFTGOODS) ×4 IMPLANT
SPONGE LAP 18X18 RF (DISPOSABLE) ×5 IMPLANT
STAPLER VISISTAT 35W (STAPLE) ×4 IMPLANT
STOCKINETTE IMPERVIOUS 9X36 MD (GAUZE/BANDAGES/DRESSINGS) ×3 IMPLANT
STRIP CLOSURE SKIN 1/2X4 (GAUZE/BANDAGES/DRESSINGS) IMPLANT
SUCTION FRAZIER HANDLE 10FR (MISCELLANEOUS)
SUCTION TUBE FRAZIER 10FR DISP (MISCELLANEOUS) ×1 IMPLANT
SUT ETHILON 3 0 PS 1 (SUTURE) ×3 IMPLANT
SUT FIBERWIRE #2 38 T-5 BLUE (SUTURE) ×8
SUT PDS AB 2-0 CT1 27 (SUTURE) IMPLANT
SUT PROLENE 3 0 PS 2 (SUTURE) ×2 IMPLANT
SUT VIC AB 0 CT1 27 (SUTURE) ×4
SUT VIC AB 0 CT1 27XBRD ANBCTR (SUTURE) ×3 IMPLANT
SUT VIC AB 2-0 CT1 27 (SUTURE) ×4
SUT VIC AB 2-0 CT1 TAPERPNT 27 (SUTURE) ×3 IMPLANT
SUT VIC AB 2-0 CT3 27 (SUTURE) IMPLANT
SUTURE FIBERWR #2 38 T-5 BLUE (SUTURE) ×2 IMPLANT
SYR CONTROL 10ML LL (SYRINGE) ×4 IMPLANT
TOWEL OR 17X24 6PK STRL BLUE (TOWEL DISPOSABLE) IMPLANT
TOWEL OR 17X26 10 PK STRL BLUE (TOWEL DISPOSABLE) ×8 IMPLANT
TUBE CONNECTING 12'X1/4 (SUCTIONS) ×1
TUBE CONNECTING 12X1/4 (SUCTIONS) ×3 IMPLANT
UNDERPAD 30X30 (UNDERPADS AND DIAPERS) ×4 IMPLANT
WATER STERILE IRR 1000ML POUR (IV SOLUTION) ×4 IMPLANT
YANKAUER SUCT BULB TIP NO VENT (SUCTIONS) ×4 IMPLANT

## 2019-02-21 NOTE — Anesthesia Preprocedure Evaluation (Addendum)
Anesthesia Evaluation  Patient identified by MRN, date of birth, ID band Patient awake    Reviewed: Allergy & Precautions, NPO status , Patient's Chart, lab work & pertinent test results  Airway Mallampati: II  TM Distance: <3 FB Neck ROM: Full    Dental  (+) Teeth Intact, Dental Advisory Given, Missing,    Pulmonary    Pulmonary exam normal breath sounds clear to auscultation       Cardiovascular hypertension, Pt. on medications Normal cardiovascular exam Rhythm:Regular Rate:Normal     Neuro/Psych  Headaches, PSYCHIATRIC DISORDERS Depression    GI/Hepatic Neg liver ROS, GERD  Medicated,  Endo/Other  Hypothyroidism Morbid obesity  Renal/GU negative Renal ROS     Musculoskeletal negative musculoskeletal ROS (+) Anterior dislocation of left elbow   Abdominal   Peds  Hematology negative hematology ROS (+)   Anesthesia Other Findings Day of surgery medications reviewed with the patient.  Reproductive/Obstetrics                            Anesthesia Physical Anesthesia Plan  ASA: III  Anesthesia Plan: General   Post-op Pain Management:    Induction: Intravenous  PONV Risk Score and Plan: 3 and Midazolam, Dexamethasone and Ondansetron  Airway Management Planned: Oral ETT  Additional Equipment:   Intra-op Plan:   Post-operative Plan: Extubation in OR  Informed Consent: I have reviewed the patients History and Physical, chart, labs and discussed the procedure including the risks, benefits and alternatives for the proposed anesthesia with the patient or authorized representative who has indicated his/her understanding and acceptance.     Dental advisory given  Plan Discussed with: CRNA  Anesthesia Plan Comments:         Anesthesia Quick Evaluation

## 2019-02-21 NOTE — Discharge Instructions (Signed)
Orthopaedic Trauma Service Discharge Instructions   General Discharge Instructions  WEIGHT BEARING STATUS: Nonweightbearing Left upper extremity   RANGE OF MOTION/ACTIVITY: finger and wrist range of motion as tolerated, sling at all times   Wound Care: daily wound care starting on 02/23/2019. See below   [place]   Discharge Pin Site Instructions  Dress pins daily with Kerlix roll starting on POD 2. Wrap the Kerlix so that it tamps the skin down around the pin-skin interface to prevent/limit motion of the skin relative to the pin.  (Pin-skin motion is the primary cause of pain and infection related to external fixator pin sites).  Remove any crust or coagulum that may obstruct drainage with a saline moistened gauze or soap and water.  After POD 3, if there is no discernable drainage on the pin site dressing, the interval for change can by increased to every other day.  You may shower with the fixator, cleaning all pin sites gently with soap and water.  If you have a surgical wound this needs to be completely dry and without drainage before showering.  The extremity can be lifted by the fixator to facilitate wound care and transfers.  Notify the office/Doctor if you experience increasing drainage, redness, or pain from a pin site, or if you notice purulent (thick, snot-like) drainage.  Discharge Wound Care Instructions  Do NOT apply any ointments, solutions or lotions to pin sites or surgical wounds.  These prevent needed drainage and even though solutions like hydrogen peroxide kill bacteria, they also damage cells lining the pin sites that help fight infection.  Applying lotions or ointments can keep the wounds moist and can cause them to breakdown and open up as well. This can increase the risk for infection. When in doubt call the office.  Surgical incisions should be dressed daily.  If any drainage is noted, use one layer of adaptic, then gauze,  Kerlix, and an ace wrap.  Once the incision is completely dry and without drainage, it may be left open to air out.  Showering may begin 36-48 hours later.  Cleaning gently with soap and water.  Traumatic wounds should be dressed daily as well.    One layer of adaptic, gauze, Kerlix, then ace wrap.  The adaptic can be discontinued once the draining has ceased    If you have a wet to dry dressing: wet the gauze with saline the squeeze as much saline out so the gauze is moist (not soaking wet), place moistened gauze over wound, then place a dry gauze over the moist one, followed by Kerlix wrap, then ace wrap.  CALL OFFICE WITH QUESTIONS OR CONCERNS [phone]   Diet: as you were eating previously.  Can use over the counter stool softeners and bowel preparations, such as Miralax, to help with bowel movements.  Narcotics can be constipating.  Be sure to drink plenty of fluids  PAIN MEDICATION USE AND EXPECTATIONS  You have likely been given narcotic medications to help control your pain.  After a traumatic event that results in an fracture (broken bone) with or without surgery, it is ok to use narcotic pain medications to help control one's pain.  We understand that everyone responds to pain differently and each individual patient will be evaluated on a regular basis for the continued need for narcotic medications. Ideally, narcotic medication use should last no more than 6-8 weeks (coinciding with fracture healing).   As a patient it is your responsibility as well  to monitor narcotic medication use and report the amount and frequency you use these medications when you come to your office visit.   We would also advise that if you are using narcotic medications, you should take a dose prior to therapy to maximize you participation.  IF YOU ARE ON NARCOTIC MEDICATIONS IT IS NOT PERMISSIBLE TO OPERATE A MOTOR VEHICLE (MOTORCYCLE/CAR/TRUCK/MOPED) OR HEAVY MACHINERY DO NOT MIX NARCOTICS WITH OTHER CNS  (CENTRAL NERVOUS SYSTEM) DEPRESSANTS SUCH AS ALCOHOL   STOP SMOKING OR USING NICOTINE PRODUCTS!!!!  As discussed nicotine severely impairs your body's ability to heal surgical and traumatic wounds but also impairs bone healing.  Wounds and bone heal by forming microscopic blood vessels (angiogenesis) and nicotine is a vasoconstrictor (essentially, shrinks blood vessels).  Therefore, if vasoconstriction occurs to these microscopic blood vessels they essentially disappear and are unable to deliver necessary nutrients to the healing tissue.  This is one modifiable factor that you can do to dramatically increase your chances of healing your injury.    (This means no smoking, no nicotine gum, patches, etc)  DO NOT USE NONSTEROIDAL ANTI-INFLAMMATORY DRUGS (NSAID'S)  Using products such as Advil (ibuprofen), Aleve (naproxen), Motrin (ibuprofen) for additional pain control during fracture healing can delay and/or prevent the healing response.  If you would like to take over the counter (OTC) medication, Tylenol (acetaminophen) is ok.  However, some narcotic medications that are given for pain control contain acetaminophen as well. Therefore, you should not exceed more than 4000 mg of tylenol in a day if you do not have liver disease.  Also note that there are may OTC medicines, such as cold medicines and allergy medicines that my contain tylenol as well.  If you have any questions about medications and/or interactions please ask your doctor/PA or your pharmacist.      ICE AND ELEVATE INJURED/OPERATIVE EXTREMITY  Using ice and elevating the injured extremity above your heart can help with swelling and pain control.  Icing in a pulsatile fashion, such as 20 minutes on and 20 minutes off, can be followed.    Do not place ice directly on skin. Make sure there is a barrier between to skin and the ice pack.    Using frozen items such as frozen peas works well as the conform nicely to the are that needs to be  iced.  USE AN ACE WRAP OR TED HOSE FOR SWELLING CONTROL  In addition to icing and elevation, Ace wraps or TED hose are used to help limit and resolve swelling.  It is recommended to use Ace wraps or TED hose until you are informed to stop.    When using Ace Wraps start the wrapping distally (farthest away from the body) and wrap proximally (closer to the body)   Example: If you had surgery on your leg or thing and you do not have a splint on, start the ace wrap at the toes and work your way up to the thigh        If you had surgery on your upper extremity and do not have a splint on, start the ace wrap at your fingers and work your way up to the upper arm  IF YOU ARE IN A SPLINT OR CAST DO NOT REMOVE IT FOR ANY REASON   If your splint gets wet for any reason please contact the office immediately. You may shower in your splint or cast as long as you keep it dry.  This can be done by wrapping  in a cast cover or garbage back (or similar)  Do Not stick any thing down your splint or cast such as pencils, money, or hangers to try and scratch yourself with.  If you feel itchy take benadryl as prescribed on the bottle for itching  IF YOU ARE IN A CAM BOOT (BLACK BOOT)  You may remove boot periodically. Perform daily dressing changes as noted below.  Wash the liner of the boot regularly and wear a sock when wearing the boot. It is recommended that you sleep in the boot until told otherwise  CALL THE OFFICE WITH ANY QUESTIONS OR CONCERNS: [phone]

## 2019-02-21 NOTE — Anesthesia Postprocedure Evaluation (Signed)
Anesthesia Post Note  Patient: Jimmi Sidener  Procedure(s) Performed: OPEN REPAIR  OF ELBOW DISLOCATION (Left Elbow) External Fixation Arm (Left Elbow)     Patient location during evaluation: PACU Anesthesia Type: General Level of consciousness: awake and alert Pain management: pain level controlled Vital Signs Assessment: post-procedure vital signs reviewed and stable Respiratory status: spontaneous breathing, nonlabored ventilation, respiratory function stable and patient connected to nasal cannula oxygen Cardiovascular status: blood pressure returned to baseline and stable Postop Assessment: no apparent nausea or vomiting Anesthetic complications: no    Last Vitals:  Vitals:   02/21/19 2030 02/21/19 2045  BP: 102/66 (!) 105/55  Pulse: 96 92  Resp: 18 11  Temp: (!) 36.1 C   SpO2: 94% 95%    Last Pain:  Vitals:   02/21/19 2045  TempSrc:   PainSc: 2                  Arend Bahl COKER

## 2019-02-21 NOTE — Anesthesia Procedure Notes (Signed)
Procedure Name: Intubation Date/Time: 02/21/2019 5:00 PM Performed by: Taavi Hoose T, CRNA Pre-anesthesia Checklist: Patient identified, Emergency Drugs available, Suction available and Patient being monitored Patient Re-evaluated:Patient Re-evaluated prior to induction Oxygen Delivery Method: Circle system utilized Preoxygenation: Pre-oxygenation with 100% oxygen Induction Type: IV induction Laryngoscope Size: Mac and 4 Grade View: Grade I Tube type: Oral Tube size: 7.5 mm Number of attempts: 1 Airway Equipment and Method: Patient positioned with wedge pillow and Stylet Placement Confirmation: ETT inserted through vocal cords under direct vision,  positive ETCO2 and breath sounds checked- equal and bilateral Secured at: 21 cm Tube secured with: Tape Dental Injury: Teeth and Oropharynx as per pre-operative assessment

## 2019-02-21 NOTE — Brief Op Note (Signed)
02/21/2019  7:35 PM  PATIENT:  Dawn Cunningham  53 y.o. female  PRE-OPERATIVE DIAGNOSIS:  Left elbow dislocation with recurrent instability  POST-OPERATIVE DIAGNOSIS:  Left elbow dislocation with recurrent instability  PROCEDURE:  Procedure(s): 1. OPEN REPAIR  OF ELBOW DISLOCATION (Left) 2. APPLICATION OF EXTERNAL FIXATION, BIOMET (Left) 3. STRESS FLUORO OF THE LEFT ELBOW  SURGEON:  Surgeon(s) and Role:    Altamese [place], MD - Primary  PHYSICIAN ASSISTANT: Ainsley Spinner, PA-C  ANESTHESIA:   general  EBL:  150 mL   BLOOD ADMINISTERED:none  DRAINS: none   LOCAL MEDICATIONS USED:  NONE  SPECIMEN:  No Specimen  DISPOSITION OF SPECIMEN:  N/A  COUNTS:  YES  TOURNIQUET:  * No tourniquets in log *  DICTATION: .Other Dictation: Dictation Number [id number]  PLAN OF CARE: Discharge to home after PACU  PATIENT DISPOSITION:  PACU - hemodynamically stable.   Delay start of Pharmacological VTE agent (>24hrs) due to surgical blood loss or risk of bleeding: yes

## 2019-02-21 NOTE — H&P (Addendum)
Orthopaedic Trauma Service (OTS) H&P  Patient ID: Dawn Cunningham Crossan MRN: [id number] DOB/AGE: 620-Mar-1967 53 y.o.  Reason for Surgery: Left elbow dislocation  HPI: Dawn Cunningham Jaspers is an 53 y.o. female presenting for closed reduction of left elbow dislocation. Patient initially seen in [facility] on 02/16/2019 after sustaining a fall at work. She notes she had a mechanical fall landing on an outstretched left arm. She had immediate pain at her left elbow with obvious deformity. She denies any pain at the shoulder or any other injuries. Was found to have posterior dislocation of the elbow which was reduced by the ED provider under conscious sedation. Post-reduction films showed improved alignment and patient was placed in a long arm splint. Was provided pain medication and instructed to follow up with orthopaedic trauma service in the outpatient setting.   Patient presented for follow up in OTS office today with continued significant pain in the left elbow. Her splint is in place arm is resting in her sling. She states that all weekend her "bone felt like they were moving around". She denies any new injuries to the arm. She has been compliant with non-weightbearing precautions. Repeat imaging performed in OTS office showed the left elbow to again be dislocated posteriorly. She is having significant pain in the arm. Notes some tingling in her fingers. Discussed with the patient the need to again reduce her elbow into appropriate alignment. After removal of the splint, patient was unable to tolerate movement or palpation of the arm. It was determine that this would best be managed in the operating room under sedation. Dr. Carola FrostHandy has agreed to assume care of this patient and will plan for closed reduction of the left elbow later today.   Patient has been NPO since at least midnight. Has not taken any medications this morning. 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 She was tested for Covid-19  on 02/15/2019 in order to return to work and tested negative at that time. She has has no symptoms of fever, chills, breathing problems.  Past Medical History:  Diagnosis Date  . Asthma   . Depression   . GERD (gastroesophageal reflux disease)   . Hypercholesterolemia   . Hypertension   . Hypothyroid   . Migraine     Past Surgical History:  Procedure Laterality Date  . CATARACT EXTRACTION Bilateral 2015  . CHOLECYSTECTOMY    . KNEE SURGERY  10/2016   scope  . PARTIAL HYSTERECTOMY     age 53    Family History  Problem Relation Age of Onset  . Cancer Mother        brain  . Atrial fibrillation Mother   . Hypertension Father   . Cancer Father        renal  . Other Father        enlarged heart  . Stroke Sister   . Hypertension Sister     Social History:  reports that she has never smoked. She has never used smokeless tobacco. She reports that she does not drink alcohol or use drugs.  Allergies:  Allergies  Allergen Reactions  . Erythromycin     intolerance  . Tetanus Toxoids     SOB, resp difficulty  . Penicillins Rash    Did it involve swelling of the face/tongue/throat, SOB, or low BP? Yes Did it involve sudden or severe rash/hives, skin peeling, or any reaction on the inside of your mouth or nose? Yes Did you need to seek medical  attention at a hospital or doctor's office? Yes When did it last happen?pt was a child If all above answers are "NO", may proceed with cephalosporin use.     Medications:  Current Meds  Medication Sig  . Calcium-Magnesium-Vitamin D (CALCIUM 1200+D3 PO) Take 2 capsules by mouth daily.  . cyanocobalamin 1000 MCG tablet Take 1,000 mcg by mouth daily.  . DULoxetine (CYMBALTA) 60 MG capsule Take 120 mg by mouth daily.  Marland Kitchen HYDROcodone-acetaminophen (NORCO/VICODIN) 5-325 MG tablet Take 1-2 tablets by mouth every 6 (six) hours as needed for severe pain.  Marland Kitchen levothyroxine (SYNTHROID) 75 MCG tablet Take 37.5 mcg by mouth daily.   Marland Kitchen  losartan (COZAAR) 100 MG tablet Take 100 mg by mouth at bedtime.  . magnesium gluconate (MAGONATE) 500 MG tablet Take 500 mg by mouth daily.  . niacin 500 MG tablet Take 500 mg by mouth at bedtime.  . Omega-3 Fatty Acids (FISH OIL PO) Take 1,200 mg by mouth daily.  . pantoprazole (PROTONIX) 40 MG tablet Take 40 mg by mouth daily.  . rosuvastatin (CRESTOR) 40 MG tablet Take 40 mg by mouth at bedtime.   Marland Kitchen zinc gluconate 50 MG tablet Take 50 mg by mouth daily.    ROS: Constitutional: No fever or chills Vision: No changes in vision ENT: No difficulty swallowing CV: No chest pain Pulm: No SOB or wheezing GI: No nausea or vomiting GU: No urgency or inability to hold urine Skin: No poor wound healing Neurologic: + tingling left hand  Psychiatric: No depression or anxiety Heme: +bruising left arm and hand Allergic: No reaction to medications or food   Exam: There were no vitals taken for this visit. General: Sitting up in chair talking, NAD.  Orientation: Alert and oriented x 3 Mood and Affect: Mood and affect appropriate Gait: normal, steady gait Coordination and balance: Within normal limits  Left upper extremity: Splint removed. Significant swelling and bruising about the elbow, forearm, and hand. Significant tenderness to palpation of the elbow. Does not tolerate any movement of the elbow joint or wrist. Able to wiggle her digits. Sensation intact to light touch of extremity. Does not tingling sensation in fingers. +radial pulse.   Right Upper Extremity: Skin without lesions. No tenderness to palpation. Full painless ROM, full strength in each muscle group without evidence of instability.Sensation intact. +radial pulse   Medical Decision Making: Imaging: AP and lateral x-rays performed in the OTS office today revealed posterior dislocation of the left elbow.  Labs: No results found for this or any previous visit (from the past 24 hour(s)).   Medical history and chart was  reviewed  Assessment/Plan: 53 year old female s/p fall at work on 02/16/2019 resulting in left elbow dislocation. Presented for office follow up today and elbow was currently dislocated.  Patient has been NPO since at least midnight. She has not taken any of her morning medications. She was placed in a well padded long arm splint in the office and was instructed to go straight from the OTS office to [place] for closed reduction of the left elbow in the OR today by Dr. Marcelino Scot. He will evaluate the stability of the elbow while in the operating room and determine the need for any ligament repair or external fixation today.    Wen Merced A. [facility] ?[phone]? (phone)

## 2019-02-21 NOTE — Transfer of Care (Signed)
Immediate Anesthesia Transfer of Care Note  Patient: Dawn Cunningham  Procedure(s) Performed: OPEN REPAIR  OF ELBOW DISLOCATION (Left Elbow) External Fixation Arm (Left Elbow)  Patient Location: PACU  Anesthesia Type:General  Level of Consciousness: awake and alert   Airway & Oxygen Therapy: Patient Spontanous Breathing and Patient connected to face mask oxygen  Post-op Assessment: Report given to RN and Post -op Vital signs reviewed and stable  Post vital signs: Reviewed and stable  Last Vitals:  Vitals Value Taken Time  BP 89/65 02/21/2019  7:41 PM  Temp    Pulse 98 02/21/2019  7:45 PM  Resp 17 02/21/2019  7:45 PM  SpO2 98 % 02/21/2019  7:45 PM  Vitals shown include unvalidated device data.  Last Pain:  Vitals:   02/21/19 1207  TempSrc:   PainSc: 0-No pain      Patients Stated Pain Goal: 2 (91/47/82 9562)  Complications: No apparent anesthesia complications

## 2019-02-22 ENCOUNTER — Encounter (HOSPITAL_COMMUNITY): Payer: Self-pay | Admitting: Orthopedic Surgery

## 2019-03-16 NOTE — Op Note (Signed)
NAMCherlyn Cunningham: Pietrzyk, Dawn Cunningham:[id number] ACCOUNT [id number] DATE OF BIRTH:[birth year] FACILITY: [facility] LOCATION: [facility] PHYSICIAN:Han Lysne H. Ysabel Stankovich, MD  OPERATIVE REPORT  DATE OF PROCEDURE:  02/21/2019  PREOPERATIVE DIAGNOSES:  Left elbow dislocation with recurrent instability.  POSTOPERATIVE DIAGNOSES:  Left elbow dislocation with recurrent instability.  PROCEDURES: 1.  Open repair of left elbow dislocation. 2.  Application of external fixator. 3.  Stress fluoroscopy of the left elbow.  SURGEON:  Myrene GalasMichael Careen Mauch, MD  ASSISTANT:  Montez MoritaKeith Paul, PA-C.  ANESTHESIA:  General.  ESTIMATED BLOOD LOSS:  150 mL  DRAINS:  None.  TOURNIQUET:  None.  LOCAL:  None.    SPECIMENS:  None.  DISPOSITION:  To PACU.  CONDITION:  Stable.  BRIEF SUMMARY FOR PROCEDURE:  The patient is a pleasant 53 year old right hand dominant female who sustained a dislocation of the elbow initially treated with closed reduction and splinting in the ED.  Upon followup, she was noted to have a recurrent  posterior dislocation.  Consequently, she was referred for immediate urgent reduction with repair of the lateral ulnar collateral ligament and possible external fixation of persistent instability was diagnosed on stress fluoroscopy.  I did discuss these  options with the patient who strongly wished to proceed.  These risks included nerve injury, vessel injury, persistent instability, heterotopic ossification loss of motion, need for further surgery, pin tract complications and others.  BRIEF SUMMARY OF PROCEDURE:  The patient was taken to the operating room where general anesthesia was induced.  The patient was positioned supine with the elbow on a radiolucent table.  A standard Kocher approach was made with dissection carried down to  the lateral epicondyle identifying a complete avulsion of the lateral ulnar collateral multi-ligament off of this area.  There was no fracture of the radial head nor was  there significant fracture of the coronoid or articular injury to the capitellum.   The joint was irrigated thoroughly removing a hematoma.  A rasp was used to roughen the surface of the lateral epicondyle and then the ligament identified within the adjacent soft tissues.  Here, a suture anchor was brought directly down securing the  lateral ulnar collateral ligament.  I then took the elbow through a range of motion identifying outstanding stability on the lateral side.  I then brought in the C-arm to check for a concentric reduction with the C-arm evaluating the elbow, immediately  it was clear that there was zero integrity to the medial ligamentous structures as the patient's elbow could essentially completely pivot around the repaired lateral side.  Consequently, the elbow was brought back up into a reduced position and here 2  pins were placed into the humerus and 2 into the ulna.  The [employer] ex-fix system was used.  The pins and bars were then secured using clamps and once again C-arm brought in identifying appropriate reduction pin placement.  Wounds were irrigated  thoroughly, closed in standard layered fashion using #1 Vicryl for the capsule, 0 for the deep subcutaneous, 2-0 and 3-0 nylon.  A sterile gently compressive dressing was applied and then the patient was awakened from anesthesia and transported to PACU  in stable condition.  Montez MoritaKeith Paul, PA-C, was present and assisted me throughout which was necessary given the technical difficulty of the case and the patient's large body habitus as well.  PROGNOSIS:  The patient will have unrestricted range of motion of the shoulder and wrist and digits.  We will plan to see her back in one week for new  x-rays to make sure she is maintaining concentric reduction.  We would anticipate continuance of this  device for 4 to 6 weeks depending on her progress.  TN/NUANCE  D:03/16/2019 T:03/16/2019 JOB:[id number]

## 2019-03-29 NOTE — Progress Notes (Signed)
Several unsuccessful attempts were made to contact pt; lvm with pre-op instructions according to pre-op checklist. Pt made aware to stop taking Aspirin (unless otherwise advised by surgeon), vitamins, fish oil and herbal medications. Do not take any NSAIDs ie: Ibuprofen, Advil, Naproxen (Aleve), Motrin, BC and Goody Powder. Pt made aware of COVID-19 restrictions. Please complete assessment on DOS.

## 2019-03-29 NOTE — Anesthesia Preprocedure Evaluation (Addendum)
Anesthesia Evaluation  Patient identified by MRN, date of birth, ID band Patient awake    Reviewed: Allergy & Precautions, NPO status , Patient's Chart, lab work & pertinent test results  Airway Mallampati: II  TM Distance: >3 FB Neck ROM: Full    Dental no notable dental hx. (+) Teeth Intact, Dental Advisory Given   Pulmonary asthma ,    Pulmonary exam normal breath sounds clear to auscultation       Cardiovascular Exercise Tolerance: Good hypertension, Pt. on medications negative cardio ROS Normal cardiovascular exam Rhythm:Regular Rate:Normal     Neuro/Psych  Headaches, PSYCHIATRIC DISORDERS Depression    GI/Hepatic Neg liver ROS, GERD  ,  Endo/Other  Hypothyroidism   Renal/GU negative Renal ROS     Musculoskeletal   Abdominal   Peds  Hematology   Anesthesia Other Findings   Reproductive/Obstetrics                            Anesthesia Physical Anesthesia Plan  ASA: III  Anesthesia Plan: General   Post-op Pain Management:    Induction: Intravenous  PONV Risk Score and Plan: 4 or greater and Treatment may vary due to age or medical condition, Dexamethasone and Ondansetron  Airway Management Planned: LMA  Additional Equipment:   Intra-op Plan:   Post-operative Plan:   Informed Consent: I have reviewed the patients History and Physical, chart, labs and discussed the procedure including the risks, benefits and alternatives for the proposed anesthesia with the patient or authorized representative who has indicated his/her understanding and acceptance.     Dental advisory given  Plan Discussed with:   Anesthesia Plan Comments: (GA)       Anesthesia Quick Evaluation

## 2019-03-30 ENCOUNTER — Encounter (HOSPITAL_COMMUNITY): Payer: Self-pay | Admitting: Surgery

## 2019-03-30 ENCOUNTER — Ambulatory Visit (HOSPITAL_COMMUNITY): Payer: No Typology Code available for payment source

## 2019-03-30 ENCOUNTER — Ambulatory Visit (HOSPITAL_COMMUNITY)
Admission: RE | Admit: 2019-03-30 | Discharge: 2019-03-30 | Disposition: A | Discharge status: Home / Self Care | Payer: No Typology Code available for payment source | Attending: Orthopedic Surgery | Admitting: Orthopedic Surgery

## 2019-03-30 ENCOUNTER — Ambulatory Visit (HOSPITAL_COMMUNITY): Payer: No Typology Code available for payment source | Admitting: Anesthesiology

## 2019-03-30 ENCOUNTER — Other Ambulatory Visit: Payer: Self-pay

## 2019-03-30 ENCOUNTER — Encounter (HOSPITAL_COMMUNITY)
Admission: RE | Disposition: A | Discharge status: Home / Self Care | Payer: Self-pay | Source: Home / Self Care | Attending: Orthopedic Surgery

## 2019-03-30 DIAGNOSIS — Z20828 Contact with and (suspected) exposure to other viral communicable diseases: Secondary | ICD-10-CM | POA: Diagnosis not present

## 2019-03-30 DIAGNOSIS — T148XXA Other injury of unspecified body region, initial encounter: Secondary | ICD-10-CM

## 2019-03-30 DIAGNOSIS — Y831 Surgical operation with implant of artificial internal device as the cause of abnormal reaction of the patient, or of later complication, without mention of misadventure at the time of the procedure: Secondary | ICD-10-CM | POA: Diagnosis not present

## 2019-03-30 DIAGNOSIS — I10 Essential (primary) hypertension: Secondary | ICD-10-CM | POA: Diagnosis not present

## 2019-03-30 DIAGNOSIS — T8489XA Other specified complication of internal orthopedic prosthetic devices, implants and grafts, initial encounter: Secondary | ICD-10-CM | POA: Diagnosis not present

## 2019-03-30 DIAGNOSIS — Z419 Encounter for procedure for purposes other than remedying health state, unspecified: Secondary | ICD-10-CM

## 2019-03-30 HISTORY — PX: EXTERNAL FIXATION REMOVAL: SHX5040

## 2019-03-30 LAB — BASIC METABOLIC PANEL
Anion gap: 13 (ref 5–15)
BUN: 17 mg/dL (ref 6–20)
CO2: 20 mmol/L — ABNORMAL LOW (ref 22–32)
Calcium: 9.6 mg/dL (ref 8.9–10.3)
Chloride: 105 mmol/L (ref 98–111)
Creatinine, Ser: 0.94 mg/dL (ref 0.44–1.00)
GFR calc Af Amer: >60 mL/min (ref >60)
GFR calc non Af Amer: >60 mL/min (ref >60)
Glucose, Bld: 106 mg/dL — ABNORMAL HIGH (ref 70–99)
Potassium: 4.3 mmol/L (ref 3.5–5.1)
Sodium: 138 mmol/L (ref 135–145)

## 2019-03-30 LAB — HEMOGLOBIN: Hemoglobin: 14.4 g/dL (ref 12.0–15.0)

## 2019-03-30 LAB — SARS CORONAVIRUS 2 BY RT PCR (HOSPITAL ORDER, PERFORMED IN ~~LOC~~ HOSPITAL LAB): SARS Coronavirus 2: NEGATIVE

## 2019-03-30 SURGERY — REMOVAL, EXTERNAL FIXATION DEVICE, UPPER EXTREMITY
Anesthesia: General | Site: Arm Upper | Laterality: Left

## 2019-03-30 MED ORDER — PROPOFOL 10 MG/ML IV BOLUS
INTRAVENOUS | Status: DC | PRN
  Administered 2019-03-30: 50 mg via INTRAVENOUS
  Administered 2019-03-30: 150 mg via INTRAVENOUS

## 2019-03-30 MED ORDER — MIDAZOLAM HCL 2 MG/2ML IJ SOLN
INTRAMUSCULAR | Status: AC
  Filled 2019-03-30: qty 2

## 2019-03-30 MED ORDER — TRAMADOL HCL 50 MG PO TABS: 50.0000 mg | ORAL_TABLET | Freq: Two times a day (BID) | ORAL | 0 refills | Status: AC | PRN

## 2019-03-30 MED ORDER — 0.9 % SODIUM CHLORIDE (POUR BTL) OPTIME
TOPICAL | Status: DC | PRN
  Administered 2019-03-30: 1000 mL

## 2019-03-30 MED ORDER — TRAMADOL HCL 50 MG PO TABS
ORAL_TABLET | ORAL | Status: AC
  Filled 2019-03-30: qty 1

## 2019-03-30 MED ORDER — POVIDONE-IODINE 10 % EX SWAB: 2.0000 "application " | Freq: Once | CUTANEOUS | Status: DC

## 2019-03-30 MED ORDER — PROPOFOL 10 MG/ML IV BOLUS
INTRAVENOUS | Status: AC
  Filled 2019-03-30: qty 20

## 2019-03-30 MED ORDER — FENTANYL CITRATE (PF) 250 MCG/5ML IJ SOLN
INTRAMUSCULAR | Status: AC
  Filled 2019-03-30: qty 5

## 2019-03-30 MED ORDER — LACTATED RINGERS IV SOLN
INTRAVENOUS | Status: DC
  Administered 2019-03-30: 10:00:00 via INTRAVENOUS

## 2019-03-30 MED ORDER — MIDAZOLAM HCL 5 MG/5ML IJ SOLN
INTRAMUSCULAR | Status: DC | PRN
  Administered 2019-03-30: 2 mg via INTRAVENOUS

## 2019-03-30 MED ORDER — TRAMADOL HCL 50 MG PO TABS
50.0000 mg | ORAL_TABLET | Freq: Once | ORAL | Status: AC
  Administered 2019-03-30: 12:00:00 50 mg via ORAL

## 2019-03-30 MED ORDER — VANCOMYCIN HCL IN DEXTROSE 1-5 GM/200ML-% IV SOLN
1000.0000 mg | INTRAVENOUS | Status: AC
  Administered 2019-03-30: 1000 mg via INTRAVENOUS

## 2019-03-30 MED ORDER — ONDANSETRON HCL 4 MG/2ML IJ SOLN
INTRAMUSCULAR | Status: DC | PRN
  Administered 2019-03-30: 4 mg via INTRAVENOUS

## 2019-03-30 MED ORDER — LIDOCAINE 2% (20 MG/ML) 5 ML SYRINGE
INTRAMUSCULAR | Status: DC | PRN
  Administered 2019-03-30: 100 mg via INTRAVENOUS

## 2019-03-30 MED ORDER — DEXAMETHASONE SODIUM PHOSPHATE 10 MG/ML IJ SOLN
INTRAMUSCULAR | Status: DC | PRN
  Administered 2019-03-30: 10 mg via INTRAVENOUS

## 2019-03-30 MED ORDER — PHENYLEPHRINE 40 MCG/ML (10ML) SYRINGE FOR IV PUSH (FOR BLOOD PRESSURE SUPPORT)
PREFILLED_SYRINGE | INTRAVENOUS | Status: DC | PRN
  Administered 2019-03-30: 80 ug via INTRAVENOUS

## 2019-03-30 MED ORDER — CHLORHEXIDINE GLUCONATE 4 % EX LIQD: 60.0000 mL | Freq: Once | CUTANEOUS | Status: DC

## 2019-03-30 MED ORDER — FENTANYL CITRATE (PF) 250 MCG/5ML IJ SOLN
INTRAMUSCULAR | Status: DC | PRN
  Administered 2019-03-30: 50 ug via INTRAVENOUS
  Administered 2019-03-30 (×3): 25 ug via INTRAVENOUS

## 2019-03-30 MED ORDER — HYDROCODONE-ACETAMINOPHEN 5-325 MG PO TABS
ORAL_TABLET | ORAL | Status: AC
  Filled 2019-03-30: qty 2

## 2019-03-30 SURGICAL SUPPLY — 40 items
BANDAGE ACE 6X5 VEL STRL LF (GAUZE/BANDAGES/DRESSINGS) ×3 IMPLANT
BANDAGE ELASTIC 4 VELCRO ST LF (GAUZE/BANDAGES/DRESSINGS) ×4 IMPLANT
BNDG ELASTIC 4X5.8 VLCR STR LF (GAUZE/BANDAGES/DRESSINGS) ×3 IMPLANT
BNDG GAUZE ELAST 4 BULKY (GAUZE/BANDAGES/DRESSINGS) ×4 IMPLANT
BRUSH SCRUB EZ PLAIN DRY (MISCELLANEOUS) ×6 IMPLANT
COVER SURGICAL LIGHT HANDLE (MISCELLANEOUS) ×6 IMPLANT
COVER WAND RF STERILE (DRAPES) ×3 IMPLANT
DRAPE C-ARM 42X72 X-RAY (DRAPES) IMPLANT
DRAPE C-ARMOR (DRAPES) ×3 IMPLANT
DRAPE U-SHAPE 47X51 STRL (DRAPES) ×3 IMPLANT
DRSG ADAPTIC 3X8 NADH LF (GAUZE/BANDAGES/DRESSINGS) ×3 IMPLANT
ELECT REM PT RETURN 9FT ADLT (ELECTROSURGICAL) ×3
ELECTRODE REM PT RTRN 9FT ADLT (ELECTROSURGICAL) ×1 IMPLANT
GAUZE SPONGE 4X4 12PLY STRL (GAUZE/BANDAGES/DRESSINGS) ×3 IMPLANT
GAUZE SPONGE 4X4 12PLY STRL LF (GAUZE/BANDAGES/DRESSINGS) ×2 IMPLANT
GLOVE BIO SURGEON STRL SZ7.5 (GLOVE) ×3 IMPLANT
GLOVE BIO SURGEON STRL SZ8 (GLOVE) ×3 IMPLANT
GLOVE BIOGEL PI IND STRL 7.5 (GLOVE) ×1 IMPLANT
GLOVE BIOGEL PI IND STRL 8 (GLOVE) ×1 IMPLANT
GLOVE BIOGEL PI INDICATOR 7.5 (GLOVE) ×2
GLOVE BIOGEL PI INDICATOR 8 (GLOVE) ×2
GOWN STRL REUS W/ TWL LRG LVL3 (GOWN DISPOSABLE) ×2 IMPLANT
GOWN STRL REUS W/ TWL XL LVL3 (GOWN DISPOSABLE) ×1 IMPLANT
GOWN STRL REUS W/TWL LRG LVL3 (GOWN DISPOSABLE) ×6
GOWN STRL REUS W/TWL XL LVL3 (GOWN DISPOSABLE) ×3
KIT BASIN OR (CUSTOM PROCEDURE TRAY) ×3 IMPLANT
KIT TURNOVER KIT B (KITS) ×3 IMPLANT
MANIFOLD NEPTUNE II (INSTRUMENTS) ×3 IMPLANT
NS IRRIG 1000ML POUR BTL (IV SOLUTION) ×3 IMPLANT
PACK ORTHO EXTREMITY (CUSTOM PROCEDURE TRAY) ×3 IMPLANT
PAD ARMBOARD 7.5X6 YLW CONV (MISCELLANEOUS) ×6 IMPLANT
PAD CAST 4YDX4 CTTN HI CHSV (CAST SUPPLIES) IMPLANT
PADDING CAST COTTON 4X4 STRL (CAST SUPPLIES) ×6
PADDING CAST COTTON 6X4 STRL (CAST SUPPLIES) ×9 IMPLANT
SPONGE LAP 18X18 RF (DISPOSABLE) ×3 IMPLANT
STAPLER VISISTAT 35W (STAPLE) IMPLANT
TOWEL GREEN STERILE (TOWEL DISPOSABLE) ×6 IMPLANT
TOWEL GREEN STERILE FF (TOWEL DISPOSABLE) ×6 IMPLANT
UNDERPAD 30X30 (UNDERPADS AND DIAPERS) ×3 IMPLANT
WATER STERILE IRR 1000ML POUR (IV SOLUTION) ×6 IMPLANT

## 2019-03-30 NOTE — Discharge Instructions (Addendum)
Orthopaedic Trauma Service Discharge Instructions   General Discharge Instructions  WEIGHT BEARING STATUS: nonweightbearing and no lifting with Left arm at this time  RANGE OF MOTION/ACTIVITY: ok to begin very  Gentle elbow motion   Wound Care: daily wound care starting on 04/02/2019. See below  Discharge Wound Care Instructions  Do NOT apply any ointments, solutions or lotions to pin sites or surgical wounds.  These prevent needed drainage and even though solutions like hydrogen peroxide kill bacteria, they also damage cells lining the pin sites that help fight infection.  Applying lotions or ointments can keep the wounds moist and can cause them to breakdown and open up as well. This can increase the risk for infection. When in doubt call the office.  Surgical incisions should be dressed daily.  If any drainage is noted, use one layer of adaptic, then gauze, Kerlix, and an ace wrap.  Once the incision is completely dry and without drainage, it may be left open to air out.  Showering may begin 36-48 hours later.  Cleaning gently with soap and water.  Traumatic wounds should be dressed daily as well.    One layer of adaptic, gauze, Kerlix, then ace wrap.  The adaptic can be discontinued once the draining has ceased    If you have a wet to dry dressing: wet the gauze with saline the squeeze as much saline out so the gauze is moist (not soaking wet), place moistened gauze over wound, then place a dry gauze over the moist one, followed by Kerlix wrap, then ace wrap.     Diet: as you were eating previously.  Can use over the counter stool softeners and bowel preparations, such as Miralax, to help with bowel movements.  Narcotics can be constipating.  Be sure to drink plenty of fluids  PAIN MEDICATION USE AND EXPECTATIONS  You have likely been given narcotic medications to help control your pain.  After a traumatic event that results in an fracture (broken bone) with or without surgery,  it is ok to use narcotic pain medications to help control one's pain.  We understand that everyone responds to pain differently and each individual patient will be evaluated on a regular basis for the continued need for narcotic medications. Ideally, narcotic medication use should last no more than 6-8 weeks (coinciding with fracture healing).   As a patient it is your responsibility as well to monitor narcotic medication use and report the amount and frequency you use these medications when you come to your office visit.   We would also advise that if you are using narcotic medications, you should take a dose prior to therapy to maximize you participation.  IF YOU ARE ON NARCOTIC MEDICATIONS IT IS NOT PERMISSIBLE TO OPERATE A MOTOR VEHICLE (MOTORCYCLE/CAR/TRUCK/MOPED) OR HEAVY MACHINERY DO NOT MIX NARCOTICS WITH OTHER CNS (CENTRAL NERVOUS SYSTEM) DEPRESSANTS SUCH AS ALCOHOL   STOP SMOKING OR USING NICOTINE PRODUCTS!!!!  As discussed nicotine severely impairs your body's ability to heal surgical and traumatic wounds but also impairs bone healing.  Wounds and bone heal by forming microscopic blood vessels (angiogenesis) and nicotine is a vasoconstrictor (essentially, shrinks blood vessels).  Therefore, if vasoconstriction occurs to these microscopic blood vessels they essentially disappear and are unable to deliver necessary nutrients to the healing tissue.  This is one modifiable factor that you can do to dramatically increase your chances of healing your injury.    (This means no smoking, no nicotine gum, patches, etc)  DO NOT  USE NONSTEROIDAL ANTI-INFLAMMATORY DRUGS (NSAID'S)  Using products such as Advil (ibuprofen), Aleve (naproxen), Motrin (ibuprofen) for additional pain control during fracture healing can delay and/or prevent the healing response.  If you would like to take over the counter (OTC) medication, Tylenol (acetaminophen) is ok.  However, some narcotic medications that are given for pain  control contain acetaminophen as well. Therefore, you should not exceed more than 4000 mg of tylenol in a day if you do not have liver disease.  Also note that there are may OTC medicines, such as cold medicines and allergy medicines that my contain tylenol as well.  If you have any questions about medications and/or interactions please ask your doctor/PA or your pharmacist.      ICE AND ELEVATE INJURED/OPERATIVE EXTREMITY  Using ice and elevating the injured extremity above your heart can help with swelling and pain control.  Icing in a pulsatile fashion, such as 20 minutes on and 20 minutes off, can be followed.    Do not place ice directly on skin. Make sure there is a barrier between to skin and the ice pack.    Using frozen items such as frozen peas works well as the conform nicely to the are that needs to be iced.  USE AN ACE WRAP OR TED HOSE FOR SWELLING CONTROL  In addition to icing and elevation, Ace wraps or TED hose are used to help limit and resolve swelling.  It is recommended to use Ace wraps or TED hose until you are informed to stop.    When using Ace Wraps start the wrapping distally (farthest away from the body) and wrap proximally (closer to the body)   Example: If you had surgery on your leg or thing and you do not have a splint on, start the ace wrap at the toes and work your way up to the thigh        If you had surgery on your upper extremity and do not have a splint on, start the ace wrap at your fingers and work your way up to the upper arm   [place]: [phone]

## 2019-03-30 NOTE — H&P (Signed)
Orthopaedic Trauma Service (OTS) Consult   Patient ID: Dawn Cunningham MRN: [id number] DOB/AGE: [birth year] 53 y.o.    HPI: Dawn Cunningham is an 53 y.o. female who sustained a left elbow dislocation 02/16/2019.  Patient was eventually taken to the OR on 02/21/2020 due to left elbow instability after being seen in initial consult at the office.  She underwent repair of her left elbow dislocation along with augmentation with an external fixator.  Patient presents today for removal of external fixator.  We did attempt to remove the external fixator in the office yesterday however the patient was having too much pain to allow for removal in the office.  No acute issues noted patient has been doing well did have some issues with the humeral pin sites but these have been treated with oral antibiotics as well as local care and have improved.  No other complaints or concerns.  Anticipate outpatient procedure with discharge home after surgery.  Past Medical History:  Diagnosis Date  . Asthma   . Depression   . GERD (gastroesophageal reflux disease)   . Hypercholesterolemia   . Hypertension   . Hypothyroid   . Migraine     Past Surgical History:  Procedure Laterality Date  . CATARACT EXTRACTION Bilateral 2015  . CHOLECYSTECTOMY    . CLOSED REDUCTION ELBOW FRACTURE Left 02/21/2019   Procedure: OPEN REPAIR  OF ELBOW DISLOCATION;  Surgeon: Myrene GalasHandy, Michael, MD;  Location: [facility] OR;  Service: Orthopedics;  Laterality: Left;  . EXTERNAL FIXATION ARM Left 02/21/2019   Procedure: External Fixation Arm;  Surgeon: Myrene GalasHandy, Michael, MD;  [facility];  Service: Orthopedics;  Laterality: Left;  . KNEE SURGERY  10/2016   scope  . PARTIAL HYSTERECTOMY     age 53    Family History  Problem Relation Age of Onset  . Cancer Mother        brain  . Atrial fibrillation Mother   . Hypertension Father   . Cancer Father        renal  . Other Father        enlarged heart  . Stroke Sister   . Hypertension  Sister     Social History:  reports that she has never smoked. She has never used smokeless tobacco. She reports that she does not drink alcohol or use drugs.  Allergies:  Allergies  Allergen Reactions  . Erythromycin     intolerance  . Tetanus Toxoids     SOB, resp difficulty  . Penicillins Rash    Did it involve swelling of the face/tongue/throat, SOB, or low BP? Yes Did it involve sudden or severe rash/hives, skin peeling, or any reaction on the inside of your mouth or nose? Yes Did you need to seek medical attention at a hospital or doctor's office? Yes When did it last happen?pt was a child If all above answers are "NO", may proceed with cephalosporin use.     Medications: I have reviewed the patient's current medications. No outpatient medications have been marked as taking for the 03/30/19 encounter [facility](Hospital Encounter).     No results found for this or any previous visit (from the past 48 hour(s)).  No results found.  Review of Systems  Constitutional: Negative for chills and fever.  Respiratory: Negative for shortness of breath and wheezing.   Cardiovascular: Negative for chest pain and palpitations.  Gastrointestinal: Negative for abdominal pain, nausea and vomiting.  Neurological: Negative for tingling and sensory change.  There were no vitals taken for this visit. Physical Exam Vitals signs and nursing note reviewed.  Constitutional:      General: She is awake.     Comments: NAD  Cardiovascular:     Rate and Rhythm: Normal rate and regular rhythm.  Pulmonary:     Effort: Pulmonary effort is normal. No respiratory distress.  Musculoskeletal:     Comments: Left upper extremity  External fixator is intact to the left arm Pin sites are stable Swelling is well controlled Surgical wound lateral elbow is well-healed Palpable radial pulse Radial, ulnar, median nerve motor and sensory functions are intact.  AIN and PIN motor function are intact     Neurological:     Mental Status: She is alert and oriented to person, place, and time.  Psychiatric:        Attention and Perception: Attention and perception normal.        Mood and Affect: Mood and affect normal.        Behavior: Behavior is cooperative.       Assessment/Plan:  53 year old female 5 weeks s/p repair of left elbow dislocation and placement of external fixator  -Left elbow dislocation s/p repair of left lateral ulnar collateral ligament and augmentation with external fixator  Patient presents today for removal of her external fixator  Risks and benefits discussed with the patient including persistent instability.  Anticipate stress fluoroscopy while patient is under anesthesia  Patient is in agreement with plan to proceed  Anticipate outpatient procedure    Anticipate initiation of ROM exercises after Ex fix removal---> refer to outpt pt   - Pain management:  NSAIDs   Ok for IV ketorolac  Ultram if needed  - DVT/PE prophylaxis:  No pharmacologic prophylaxis indicated  - ID:   periop abx   - Dispo:  OR for removal of ex fix   Dc home after procedure    Jari Pigg, PA-C [phone] (C) 03/30/2019, 8:09 AM  [facility] [place] [address] [phone] Domingo Sep (F)

## 2019-03-30 NOTE — Transfer of Care (Signed)
Immediate Anesthesia Transfer of Care Note  Patient: Dawn Cunningham  Procedure(s) Performed: REMOVAL EXTERNAL FIXATION  LEFT ELBOW (Left Arm Upper)  Patient Location: PACU  Anesthesia Type:General  Level of Consciousness: awake, alert  and oriented  Airway & Oxygen Therapy: Patient Spontanous Breathing and Patient connected to nasal cannula oxygen  Post-op Assessment: Report given to RN and Post -op Vital signs reviewed and stable  Post vital signs: Reviewed and stable  Last Vitals:  Vitals Value Taken Time  BP    Temp    Pulse    Resp    SpO2      Last Pain:  Vitals:   03/30/19 0923  TempSrc:   PainSc: 0-No pain      Patients Stated Pain Goal: 2 (60/63/01 6010)  Complications: No apparent anesthesia complications

## 2019-03-30 NOTE — Anesthesia Procedure Notes (Signed)
Procedure Name: LMA Insertion Date/Time: 03/30/2019 11:02 AM Performed by: Wilburn Cornelia, CRNA Pre-anesthesia Checklist: Patient identified, Emergency Drugs available, Suction available, Patient being monitored and Timeout performed Patient Re-evaluated:Patient Re-evaluated prior to induction Oxygen Delivery Method: Circle system utilized Preoxygenation: Pre-oxygenation with 100% oxygen Induction Type: IV induction LMA: LMA inserted LMA Size: 4.0 Number of attempts: 1 Placement Confirmation: positive ETCO2 and breath sounds checked- equal and bilateral Tube secured with: Tape Dental Injury: Teeth and Oropharynx as per pre-operative assessment

## 2019-03-31 ENCOUNTER — Encounter (HOSPITAL_COMMUNITY): Payer: Self-pay | Admitting: Orthopedic Surgery

## 2019-03-31 NOTE — Anesthesia Postprocedure Evaluation (Signed)
Anesthesia Post Note  Patient: Dawn Cunningham  Procedure(s) Performed: REMOVAL EXTERNAL FIXATION  LEFT ELBOW (Left Arm Upper)     Patient location during evaluation: PACU Anesthesia Type: General Level of consciousness: awake and alert Pain management: pain level controlled Vital Signs Assessment: post-procedure vital signs reviewed and stable Respiratory status: spontaneous breathing, nonlabored ventilation, respiratory function stable and patient connected to nasal cannula oxygen Cardiovascular status: blood pressure returned to baseline and stable Postop Assessment: no apparent nausea or vomiting Anesthetic complications: no    Last Vitals:  Vitals:   03/30/19 0905 03/30/19 1147  BP: 122/60 (!) 120/92  Pulse: 85 (!) 110  Resp: 18 (!) 23  Temp: 36.9 C (!) 36.3 C  SpO2: 97% 96%    Last Pain:  Vitals:   03/30/19 1147  TempSrc:   PainSc: 3                  Barnet Glasgow

## 2019-04-20 NOTE — Brief Op Note (Signed)
03/30/2019  5:19 PM  PATIENT:  Berniece Pap  53 y.o. female  PRE-OPERATIVE DIAGNOSIS:   1. S/p Dislocation of left elbow 2. Retained fixator with ulcerated pin sites  POST-OPERATIVE DIAGNOSIS:   1. S/p Dislocation of left elbow 2. Retained fixator with ulcerated pin sites  PROCEDURE:  Procedure(s): 1. REMOVAL EXTERNAL FIXATION  LEFT ELBOW (Left) 2. CURETTAGE OF ULCERATED PIN SITES 3. STRESS EVALUATION OF LEFT ELBOW JOINT UNDER FLUORSOCOPY  SURGEON:  Surgeon(s) and Role:    Altamese Newdale, MD - Primary  PHYSICIAN ASSISTANT: Ainsley Spinner, PA-C  ANESTHESIA:   general  EBL:  5 mL   BLOOD ADMINISTERED:none  DRAINS: none   LOCAL MEDICATIONS USED:  NONE  SPECIMEN:  No Specimen  DISPOSITION OF SPECIMEN:  N/A  COUNTS:  YES  TOURNIQUET:  * No tourniquets in log *  DICTATION: .Other Dictation: Dictation Number [id number]  PLAN OF CARE: Discharge to home after PACU  PATIENT DISPOSITION:  PACU - hemodynamically stable.   Delay start of Pharmacological VTE agent (>24hrs) due to surgical blood loss or risk of bleeding: no

## 2019-04-21 NOTE — Op Note (Signed)
NAMEGRISELA, MESCH MEDICAL RECORD [id number] ACCOUNT [id number] DATE OF BIRTH:Nov 10, 1965 FACILITY: [facility] LOCATION: [facility] PHYSICIAN:Jazelyn Sipe H. Valerie Fredin, MD  OPERATIVE REPORT  DATE OF PROCEDURE:  03/30/2019  PREOPERATIVE DIAGNOSES: 1.  Status post dislocation of left elbow. 2.  Retained external fixator with ulcerated pin sites.  POSTOPERATIVE DIAGNOSES: 1.  Status post dislocation of left elbow. 2.  Retained external fixator with ulcerated pin sites.  PROCEDURES: 1.  Removal of external fixator under anesthesia, left elbow. 2.  Curettage of ulcerated pin sites, humerus and ulna. 3.  Stress evaluation of the left elbow joint under fluoroscopy.  SURGEON:  Altamese Hi-Nella, MD  ASSISTANT:  Ainsley Spinner, PA-C  ANESTHESIA:  General.  COMPLICATIONS:  None.  ESTIMATED BLOOD LOSS:  5 mL.  DISPOSITION:  To PACU.  CONDITION:  Stable.  INDICATION FOR PROCEDURE:  The patient is a 53 year old right-hand dominant female who sustained a dislocation of the left elbow and underwent reduction and developed recurrent instability and dislocation that was treated with surgical repair, augmented  with external fixation in order to maintain a concentric reduction.  The patient has been in a static fixator.  She now presents for removal and has developed ulcerations that are most significant around the humerus.  I did discuss with her the risks and  benefits of surgery including the possibility of recurrent instability, loss of reduction, need for further procedures, failure to prevent infection from the ulcerative pin sites, DVT, PE, recurrent loss of motion, and multiple others including  anesthetic complications.  The patient acknowledged these risks and strongly wished to proceed.  BRIEF SUMMARY OF PROCEDURE:  The patient was taken to the operating room where general anesthesia was induced.  She did receive preoperative antibiotics.  The left upper extremity then underwent a thorough  chlorhexidine wash and scrub after induction.   The external fixator was removed, another scrub was performed, and sterile draping.  At that time, a curettage was performed from the skin layer through the subcutaneous tissue down to muscle, fascia, and bone.  These were copiously irrigated at both the  humerus and the ulna, removing all the fibrinous material along the extensor fixator pin tract.  C-arm was then brought in, and a stress fluoroscopy performed of the elbow joint while taking it through range of motion.  We did not identify any  subluxation of the ulnohumeral joint or radiocapitellar joint to suggest significant persistent instability.  A bulky dressing nonstick dressing was applied.  The patient was then awakened from anesthesia and transported to the PACU in stable condition.   Ainsley Spinner, PA-C, was present assisting me throughout.  PROGNOSIS:  The patient will return to the office in 10 days.  She may remove the dressing and shower in 2 days, and we will discuss progression of therapy at her followup appointment.  LN/NUANCE  D:04/20/2019 T:04/20/2019 JOB:[id number]

## 2019-09-21 DIAGNOSIS — G5602 Carpal tunnel syndrome, left upper limb: Secondary | ICD-10-CM | POA: Insufficient documentation

## 2019-11-13 DIAGNOSIS — S63096A Other dislocation of unspecified wrist and hand, initial encounter: Secondary | ICD-10-CM | POA: Insufficient documentation

## 2020-01-30 ENCOUNTER — Encounter: Payer: Self-pay | Admitting: Cardiology

## 2020-01-30 ENCOUNTER — Ambulatory Visit (INDEPENDENT_AMBULATORY_CARE_PROVIDER_SITE_OTHER): Payer: BC Managed Care – PPO | Admitting: Cardiology

## 2020-01-30 ENCOUNTER — Other Ambulatory Visit: Payer: Self-pay

## 2020-01-30 DIAGNOSIS — R0789 Other chest pain: Secondary | ICD-10-CM | POA: Diagnosis not present

## 2020-01-30 DIAGNOSIS — E785 Hyperlipidemia, unspecified: Secondary | ICD-10-CM | POA: Insufficient documentation

## 2020-01-30 DIAGNOSIS — R0600 Dyspnea, unspecified: Secondary | ICD-10-CM | POA: Diagnosis not present

## 2020-01-30 DIAGNOSIS — E039 Hypothyroidism, unspecified: Secondary | ICD-10-CM | POA: Insufficient documentation

## 2020-01-30 DIAGNOSIS — R0609 Other forms of dyspnea: Secondary | ICD-10-CM | POA: Insufficient documentation

## 2020-01-30 LAB — LIPID PANEL
Chol/HDL Ratio: 3.9 ratio (ref 0.0–4.4)
Cholesterol, Total: 162 mg/dL (ref 100–199)
HDL: 42 mg/dL (ref >39)
LDL Chol Calc (NIH): 90 mg/dL (ref 0–99)
Triglycerides: 173 mg/dL — ABNORMAL HIGH (ref 0–149)
VLDL Cholesterol Cal: 30 mg/dL (ref 5–40)

## 2020-01-30 LAB — THYROID PANEL
Free Thyroxine Index: 1.6 (ref 1.2–4.9)
T3 Uptake Ratio: 24 % (ref 24–39)
T4, Total: 6.6 ug/dL (ref 4.5–12.0)

## 2020-01-30 LAB — TSH: TSH: 1.27 u[IU]/mL (ref 0.450–4.500)

## 2020-01-30 NOTE — Patient Instructions (Signed)
Medication Instructions:  Your physician recommends that you continue on your current medications as directed. Please refer to the Current Medication list given to you today.  *If you need a refill on your cardiac medications before your next appointment, please call your pharmacy*   Lab Work: Your physician recommends that you return for lab work: lipid, tsh, thyroid panel   If you have labs (blood work) drawn today and your tests are completely normal, you will receive your results only by: Marland Kitchen MyChart Message (if you have MyChart) OR . A paper copy in the mail If you have any lab test that is abnormal or we need to change your treatment, we will call you to review the results.   Testing/Procedures: Your physician has requested that you have an echocardiogram. Echocardiography is a painless test that uses sound waves to create images of your heart. It provides your doctor with information about the size and shape of your heart and how well your heart's chambers and valves are working. This procedure takes approximately one hour. There are no restrictions for this procedure.    [facility] [address] [place], [place] [address] Phone:  [phone]    Please arrive 15 minutes prior to your appointment time for registration and insurance purposes.  The test will take approximately 3 to 4 hours to complete; you may bring reading material.  If someone comes with you to your appointment, they will need to remain in the main lobby due to limited space in the testing area. **If you are pregnant or breastfeeding, please notify the nuclear lab prior to your appointment**  How to prepare for your Myocardial Perfusion Test: . Do not eat or drink 3 hours prior to your test, except you may have water. . Do not consume products containing caffeine (regular or decaffeinated) 12 hours prior to your test. (ex: coffee, chocolate, sodas, tea). . Do bring a list of your  current medications with you.  If not listed below, you may take your medications as normal. . Do wear comfortable clothes (no dresses or overalls) and walking shoes, tennis shoes preferred (No heels or open toe shoes are allowed). . Do NOT wear cologne, perfume, aftershave, or lotions (deodorant is allowed). . If these instructions are not followed, your test will have to be rescheduled.  Please report to [address] for your test.  If you have questions or concerns about your appointment, you can call the [facility] Lab at [phone].  If you cannot keep your appointment, please provide 24 hours notification to the Nuclear Lab, to avoid a possible $50 charge to your account.    Follow-Up: At [facility], you and your health needs are our priority.  As part of our continuing mission to provide you with exceptional heart care, we have created designated Provider Care Teams.  These Care Teams include your primary Cardiologist (physician) and Advanced Practice Providers (APPs -  Physician Assistants and Nurse Practitioners) who all work together to provide you with the care you need, when you need it.  We recommend signing up for the patient portal called "MyChart".  Sign up information is provided on this After Visit Summary.  MyChart is used to connect with patients for Virtual Visits (Telemedicine).  Patients are able to view lab/test results, encounter notes, upcoming appointments, etc.  Non-urgent messages can be sent to your provider as well.   To learn more about what you can do with  MyChart, go to [email or web address].    Your next appointment:   1 month(s)  The format for your next appointment:   In Person  Provider:   Jenne Campus, MD   Other Instructions   Echocardiogram An echocardiogram is a procedure that uses painless sound waves (ultrasound) to produce an image of the heart. Images from an echocardiogram can provide  important information about:  Signs of coronary artery disease (CAD).  Aneurysm detection. An aneurysm is a weak or damaged part of an artery wall that bulges out from the normal force of blood pumping through the body.  Heart size and shape. Changes in the size or shape of the heart can be associated with certain conditions, including heart failure, aneurysm, and CAD.  Heart muscle function.  Heart valve function.  Signs of a past heart attack.  Fluid buildup around the heart.  Thickening of the heart muscle.  A tumor or infectious growth around the heart valves. Tell a health care provider about:  Any allergies you have.  All medicines you are taking, including vitamins, herbs, eye drops, creams, and over-the-counter medicines.  Any blood disorders you have.  Any surgeries you have had.  Any medical conditions you have.  Whether you are pregnant or may be pregnant. What are the risks? Generally, this is a safe procedure. However, problems may occur, including:  Allergic reaction to dye (contrast) that may be used during the procedure. What happens before the procedure? No specific preparation is needed. You may eat and drink normally. What happens during the procedure?   An IV tube may be inserted into one of your veins.  You may receive contrast through this tube. A contrast is an injection that improves the quality of the pictures from your heart.  A gel will be applied to your chest.  A wand-like tool (transducer) will be moved over your chest. The gel will help to transmit the sound waves from the transducer.  The sound waves will harmlessly bounce off of your heart to allow the heart images to be captured in real-time motion. The images will be recorded on a computer. The procedure may vary among health care providers and hospitals. What happens after the procedure?  You may return to your normal, everyday life, including diet, activities, and medicines,  unless your health care provider tells you not to do that. Summary  An echocardiogram is a procedure that uses painless sound waves (ultrasound) to produce an image of the heart.  Images from an echocardiogram can provide important information about the size and shape of your heart, heart muscle function, heart valve function, and fluid buildup around your heart.  You do not need to do anything to prepare before this procedure. You may eat and drink normally.  After the echocardiogram is completed, you may return to your normal, everyday life, unless your health care provider tells you not to do that. This information is not intended to replace advice given to you by your health care provider. Make sure you discuss any questions you have with your health care provider. Document Revised: 12/22/2018 Document Reviewed: 10/03/2016 Elsevier Patient Education  2020 [place].   Cardiac Nuclear Scan A cardiac nuclear scan is a test that measures blood flow to the heart when a person is resting and when he or she is exercising. The test looks for problems such as:  Not enough blood reaching a portion of the heart.  The heart muscle not working normally. You may need this  test if:  You have heart disease.  You have had abnormal lab results.  You have had heart surgery or a balloon procedure to open up blocked arteries (angioplasty).  You have chest pain.  You have shortness of breath. In this test, a radioactive dye (tracer) is injected into your bloodstream. After the tracer has traveled to your heart, an imaging device is used to measure how much of the tracer is absorbed by or distributed to various areas of your heart. This procedure is usually done at a hospital and takes 2-4 hours. Tell a health care provider about:  Any allergies you have.  All medicines you are taking, including vitamins, herbs, eye drops, creams, and over-the-counter medicines.  Any problems you or family  members have had with anesthetic medicines.  Any blood disorders you have.  Any surgeries you have had.  Any medical conditions you have.  Whether you are pregnant or may be pregnant. What are the risks? Generally, this is a safe procedure. However, problems may occur, including:  Serious chest pain and heart attack. This is only a risk if the stress portion of the test is done.  Rapid heartbeat.  Sensation of warmth in your chest. This usually passes quickly.  Allergic reaction to the tracer. What happens before the procedure?  Ask your health care provider about changing or stopping your regular medicines. This is especially important if you are taking diabetes medicines or blood thinners.  Follow instructions from your health care provider about eating or drinking restrictions.  Remove your jewelry on the day of the procedure. What happens during the procedure?  An IV will be inserted into one of your veins.  Your health care provider will inject a small amount of radioactive tracer through the IV.  You will wait for 20-40 minutes while the tracer travels through your bloodstream.  Your heart activity will be monitored with an electrocardiogram (ECG).  You will lie down on an exam table.  Images of your heart will be taken for about 15-20 minutes.  You may also have a stress test. For this test, one of the following may be done: ? You will exercise on a treadmill or stationary bike. While you exercise, your heart's activity will be monitored with an ECG, and your blood pressure will be checked. ? You will be given medicines that will increase blood flow to parts of your heart. This is done if you are unable to exercise.  When blood flow to your heart has peaked, a tracer will again be injected through the IV.  After 20-40 minutes, you will get back on the exam table and have more images taken of your heart.  Depending on the type of tracer used, scans may need to be  repeated 3-4 hours later.  Your IV line will be removed when the procedure is over. The procedure may vary among health care providers and hospitals. What happens after the procedure?  Unless your health care provider tells you otherwise, you may return to your normal schedule, including diet, activities, and medicines.  Unless your health care provider tells you otherwise, you may increase your fluid intake. This will help to flush the contrast dye from your body. Drink enough fluid to keep your urine pale yellow.  Ask your health care provider, or the department that is doing the test: ? When will my results be ready? ? How will I get my results? Summary  A cardiac nuclear scan measures the blood flow to  the heart when a person is resting and when he or she is exercising.  Tell your health care provider if you are pregnant.  Before the procedure, ask your health care provider about changing or stopping your regular medicines. This is especially important if you are taking diabetes medicines or blood thinners.  After the procedure, unless your health care provider tells you otherwise, increase your fluid intake. This will help flush the contrast dye from your body.  After the procedure, unless your health care provider tells you otherwise, you may return to your normal schedule, including diet, activities, and medicines. This information is not intended to replace advice given to you by your health care provider. Make sure you discuss any questions you have with your health care provider. Document Revised: 02/14/2018 Document Reviewed: 02/14/2018 Elsevier Patient Education  [place].

## 2020-01-30 NOTE — Progress Notes (Signed)
Cardiology Consultation:    Date:  01/30/2020   ID:  Dawn Cunningham, DOB September 25, 1965, MRN [id number]  PCP:  Dema Severin, NP  Cardiologist:  Gypsy Balsam, MD   Referring MD: Marcellus Scott, MD   Chief Complaint  Patient presents with  . New Patient (Initial Visit)    SOB, Cholesterol, Med Mgt   Have high cholesterol  History of Present Illness:    Dawn Cunningham is a 54 y.o. female who is being seen today for the evaluation of dyslipidemia at the request of Chodri, Eual Fines, MD.  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  Chest pain happen at different situations usually at rest she would describe uneasy sensation in the chest.  When she walks around she will get short of breath but no chest pain.  Sensation that happen at rest last for a few minutes not relief by drinking or eating, there is no sweating no shortness of breath associated with this sensation.  The most disturbing part about her story is the fact that she got very high cholesterol.  Apparently she has been taking 40 mg of Crestor in spite of that her LDL on last assessment was 218.  She said before she got good cholesterol and since her accident when she sustained injury to her left elbow her cholesterol became very high with low LDL she is also taking niacin.  Denies having any cardiac problems, no myocardial infarction no CVA no stroke.  Interestingly there is no family history of premature coronary artery disease or premature vascular problem.  Her father lived until 54.  She did not smoke.  She does not have high blood pressure does not have diabetes the issue is problem with her cholesterol as well as some atypical chest pain.  Past Medical History:  Diagnosis Date  . Asthma   . Depression   . GERD (gastroesophageal reflux disease)   . Hypercholesterolemia   . Hypertension   . Hypothyroid   . Migraine     Past Surgical History:    Procedure Laterality Date  . CATARACT EXTRACTION Bilateral 2015  . CHOLECYSTECTOMY    . CLOSED REDUCTION ELBOW FRACTURE Left 02/21/2019   Procedure: OPEN REPAIR  OF ELBOW DISLOCATION;  Surgeon: Myrene Galas, MD;  Location: [facility] OR;  Service: Orthopedics;  Laterality: Left;  . EXTERNAL FIXATION ARM Left 02/21/2019   Procedure: External Fixation Arm;  Surgeon: Myrene Galas, MD;  Location: [facility] OR;  Service: Orthopedics;  Laterality: Left;  . EXTERNAL FIXATION REMOVAL Left 03/30/2019   Procedure: REMOVAL EXTERNAL FIXATION  LEFT ELBOW;  Surgeon: Myrene Galas, MD;  Location: [facility] OR;  Service: Orthopedics;  Laterality: Left;  . KNEE SURGERY  10/2016   scope  . PARTIAL HYSTERECTOMY     age 22    Current Medications: Current Meds  Medication Sig  . acetaminophen (TYLENOL) 650 MG CR tablet Take 1,300 mg by mouth every 8 (eight) hours as needed for pain.  Marland Kitchen buPROPion (WELLBUTRIN) 100 MG tablet Take 100 mg by mouth 2 (two) times daily.  . Calcium-Magnesium-Vitamin D (CALCIUM 1200+D3 PO) Take 2 capsules by mouth daily.  . cyanocobalamin 1000 MCG tablet Take 1,000 mcg by mouth daily.  . DULoxetine (CYMBALTA) 60 MG capsule Take 120 mg by mouth daily.  Marland Kitchen levothyroxine (SYNTHROID) 75 MCG tablet Take 37.5 mcg by mouth daily.   Marland Kitchen losartan (COZAAR) 50 MG tablet Take 50 mg by mouth  at bedtime.   . niacin 500 MG tablet Take 500 mg by mouth at bedtime.  . pantoprazole (PROTONIX) 40 MG tablet Take 40 mg by mouth daily.  . rosuvastatin (CRESTOR) 40 MG tablet Take 40 mg by mouth at bedtime.      Allergies:   Tetanus toxoids, Latex, Erythromycin, Hydrocodone, and Penicillins   Social History   Socioeconomic History  . Marital status: Married    Spouse name: Not on file  . Number of children: Not on file  . Years of education: Not on file  . Highest education level: Not on file  Occupational History  . Not on file  Tobacco Use  . Smoking status: Never Smoker  . Smokeless tobacco: Never Used   Substance and Sexual Activity  . Alcohol use: No  . Drug use: No  . Sexual activity: Not on file  Other Topics Concern  . 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 Difficulty of Paying Living Expenses:   Food Insecurity:   . Worried About Charity fundraiser in the Last Year:   . Arboriculturist in the Last Year:   Transportation Needs:   . Film/video editor (Medical):   Marland Kitchen Lack of Transportation (Non-Medical):   Physical Activity:   . Days of Exercise per Week:   . Minutes of Exercise per Session:   Stress:   . Feeling of Stress :   Social Connections:   . Frequency of Communication with Friends and Family:   . Frequency of Social Gatherings with Friends and Family:   . Attends Religious Services:   . Active Member of Clubs or Organizations:   . Attends Archivist Meetings:   Marland Kitchen Marital Status:      Family History: The patient's family history includes Atrial fibrillation in her mother; Cancer in her father and mother; Hypertension in her father and sister; Other in her father; Stroke in her sister. ROS:   Please see the history of present illness.    All 14 point review of systems negative except as described per history of present illness.  EKGs/Labs/Other Studies Reviewed:    The following studies were reviewed today:   EKG:  EKG is  ordered today.  The ekg ordered today demonstrates her EKG today showed normal sinus rhythm, low voltage EKG, cannot rule out anterior wall myocardial infarction, no ST segment changes.  Recent Labs: 02/21/2019: Platelets 416 03/30/2019: BUN 17; Creatinine, Ser 0.94; Hemoglobin 14.4; Potassium 4.3; Sodium 138  Recent Lipid Panel No results found for: CHOL, TRIG, HDL, CHOLHDL, VLDL, LDLCALC, LDLDIRECT  Physical Exam:    VS:  BP 102/78   Pulse (!) 101   Ht 5\' 5"  (1.651 m)    Wt 243 lb (110.2 kg)   SpO2 99%   BMI 40.44 kg/m     Wt Readings from Last 3 Encounters:  01/30/20 243 lb (110.2 kg)  03/30/19 240 lb (108.9 kg)  02/21/19 250 lb 1 oz (113.4 kg)     GEN:  Well nourished, well developed in no acute distress HEENT: Normal NECK: No JVD; No carotid bruits LYMPHATICS: No lymphadenopathy CARDIAC: RRR, no murmurs, no rubs, no gallops RESPIRATORY:  Clear to auscultation without rales, wheezing or rhonchi  ABDOMEN: Soft, non-tender, non-distended MUSCULOSKELETAL:  No edema;  No deformity  SKIN: Warm and dry NEUROLOGIC:  Alert and oriented x 3 PSYCHIATRIC:  Normal affect   ASSESSMENT:    1. Atypical chest pain   2. Dyslipidemia   3. Acquired hypothyroidism   4. Dyspnea on exertion    PLAN:    In order of problems listed above:  1. Atypical chest pain but with this lady with very elevated cholesterol we need to rule out coronary artery disease, therefore, I asked her to start taking 1 baby aspirin every single day and I will schedule her to have Lexiscan. 2. Dyslipidemia with LDL cholesterol 218 in spite of Crestor 40.  I will recheck her fasting lipid profile today, I talked to her about potentially adding Zetia as well as I initiated conversation about PCSK9 agent.  She is not too eager to start and intensify her therapy.  I will refer her to our lipid clinic for conversation.  I am surprised that in spite of her very high cholesterol we so far do not have any sequela of it at least not based on investigation I did so far.  Interestingly because she meets criteria for potentially familial hyperlipidemia but there is no family members of premature coronary artery disease.  Therefore we are dealing with mutation or with different type of hyperlipidemia.  Again should be referred to lipid clinic to answer those questions. 3. History of hypothyroidism.  I will check her TSH as well as thyroid profile.  She does have low voltage EKG which can suggest of  hypothyroidism but at the same time she is not bradycardic therefore I doubt this is the issue but need to be checked and verified. 4. Dyspnea on exertion echocardiogram will be done and another reason to do echocardiogram is to check to make sure she does not have any pericardial effusion with her low voltage EKG as well as to rule out cardiomyopathy.   Overall she is a very interesting lady.  Her very high cholesterol will recheck today.  She is taking high intensity statin already will be referred to lipid clinic for consideration of more advanced therapy.  I would initiate a conversation about potentially adding Zetia which definitely will not be sufficient as well as PCSK9 agent would probably be most appropriate for her with such a high cholesterol.  Her LDL is above 190.  Therefore, she need to be on maximally tolerated statin and her LDL need to be less than 100.  She is very much aware of good diet and she is trying to lose some weight we did talk also about need to exercise.   Medication Adjustments/Labs and Tests Ordered: Current medicines are reviewed at length with the patient today.  Concerns regarding medicines are outlined above.  No orders of the defined types were placed in this encounter.  No orders of the defined types were placed in this encounter.   Signed, Georgeanna Lea, MD, [facility]. 01/30/2020 10:31 AM    [facility]

## 2020-02-13 ENCOUNTER — Telehealth (HOSPITAL_COMMUNITY): Payer: Self-pay | Admitting: *Deleted

## 2020-02-13 NOTE — Telephone Encounter (Signed)
Left message on voicemail per DPR in reference to upcoming appointment scheduled on 02/20/20 at 8:00 with detailed instructions given per Myocardial Perfusion Study Information Sheet for the test. LM to arrive 15 minutes early, and that it is imperative to arrive on time for appointment to keep from having the test rescheduled. If you need to cancel or reschedule your appointment, please call the office within 24 hours of your appointment. Failure to do so may result in a cancellation of your appointment, and a $50 no show fee. Phone number given for call back for any questions.

## 2020-02-20 ENCOUNTER — Other Ambulatory Visit: Payer: Self-pay

## 2020-02-20 ENCOUNTER — Ambulatory Visit (INDEPENDENT_AMBULATORY_CARE_PROVIDER_SITE_OTHER): Payer: BC Managed Care – PPO

## 2020-02-20 DIAGNOSIS — R0789 Other chest pain: Secondary | ICD-10-CM

## 2020-02-20 DIAGNOSIS — R0600 Dyspnea, unspecified: Secondary | ICD-10-CM

## 2020-02-20 LAB — MYOCARDIAL PERFUSION IMAGING
LV dias vol: 83 mL (ref 46–106)
LV sys vol: 30 mL
Peak HR: 100 {beats}/min
Rest HR: 76 {beats}/min
SDS: 0
SRS: 2
SSS: 2
TID: 0.99

## 2020-02-20 LAB — ECHOCARDIOGRAM COMPLETE
Height: 65 in
Weight: 3888 oz

## 2020-02-20 MED ORDER — PERFLUTREN LIPID MICROSPHERE: 1.0000 mL | INTRAVENOUS | 0 refills | Status: AC | PRN

## 2020-02-20 MED ORDER — TECHNETIUM TC 99M TETROFOSMIN IV KIT
10.1000 | PACK | Freq: Once | INTRAVENOUS | Status: AC | PRN
  Administered 2020-02-20: 10.1 via INTRAVENOUS

## 2020-02-20 MED ORDER — TECHNETIUM TC 99M TETROFOSMIN IV KIT
30.9000 | PACK | Freq: Once | INTRAVENOUS | Status: AC | PRN
  Administered 2020-02-20: 30.9 via INTRAVENOUS

## 2020-02-20 MED ORDER — REGADENOSON 0.4 MG/5ML IV SOLN
0.4000 mg | Freq: Once | INTRAVENOUS | Status: AC
  Administered 2020-02-20: 0.4 mg via INTRAVENOUS

## 2020-02-20 NOTE — Progress Notes (Signed)
Complete echocardiogram with contrast performed.  Jimmy Serene Kopf RDCS, RVT  

## 2020-02-21 ENCOUNTER — Telehealth: Payer: Self-pay | Admitting: Cardiology

## 2020-02-21 DIAGNOSIS — I513 Intracardiac thrombosis, not elsewhere classified: Secondary | ICD-10-CM

## 2020-02-21 NOTE — Telephone Encounter (Signed)
Follow Up:     Pt returning a call, concerning her test results,

## 2020-02-21 NOTE — Telephone Encounter (Signed)
-----   Message from Georgeanna Lea, MD sent at 02/21/2020 10:22 AM EDT ----- Echo showed possibly LV Thrombus, needs heart MRI

## 2020-02-21 NOTE — Addendum Note (Signed)
Addended by: Lita Mains on: 02/21/2020 02:01 PM   Modules accepted: Orders

## 2020-02-21 NOTE — Telephone Encounter (Signed)
Called patient informed her that we cannot do a cardiac mri at [place] only [place]. Patient verbally understood. She is aware someone will call her with the appointment once it is precerted through insurance.

## 2020-02-21 NOTE — Telephone Encounter (Signed)
Returned call to pt.  She has been made aware of her stress / echo results. Pt does want to have her Cardiac MRI done at [facility]. Will route note to [facility], RN, to make sure it gets set up. Will put order in Epic, but I know [facility] requires their order form as well.

## 2020-02-26 ENCOUNTER — Other Ambulatory Visit: Payer: Self-pay

## 2020-02-27 ENCOUNTER — Telehealth: Payer: Self-pay | Admitting: *Deleted

## 2020-02-27 ENCOUNTER — Encounter: Payer: Self-pay | Admitting: *Deleted

## 2020-02-27 NOTE — Telephone Encounter (Signed)
Spoke with patient regarding appointment for Cardiac MRI scheduled Monday 03/25/20 at 8:00 am at [facility]---Arrival time is 7:30 am 1st floor admissions office----will mail information to patient.  She voiced her understanding.

## 2020-02-27 NOTE — Telephone Encounter (Signed)
Left message for patient to call regarding appointment for Cardiac MRI

## 2020-03-04 ENCOUNTER — Ambulatory Visit (INDEPENDENT_AMBULATORY_CARE_PROVIDER_SITE_OTHER): Payer: BC Managed Care – PPO | Admitting: Cardiology

## 2020-03-04 ENCOUNTER — Encounter: Payer: Self-pay | Admitting: Cardiology

## 2020-03-04 ENCOUNTER — Other Ambulatory Visit: Payer: Self-pay

## 2020-03-04 VITALS — BP 123/84 | HR 90 | Ht 65.0 in | Wt 245.2 lb

## 2020-03-04 DIAGNOSIS — R0609 Other forms of dyspnea: Secondary | ICD-10-CM

## 2020-03-04 DIAGNOSIS — E785 Hyperlipidemia, unspecified: Secondary | ICD-10-CM | POA: Diagnosis not present

## 2020-03-04 DIAGNOSIS — R0789 Other chest pain: Secondary | ICD-10-CM | POA: Diagnosis not present

## 2020-03-04 DIAGNOSIS — R0600 Dyspnea, unspecified: Secondary | ICD-10-CM

## 2020-03-04 MED ORDER — NITROGLYCERIN 0.4 MG SL SUBL: 0.4000 mg | SUBLINGUAL_TABLET | SUBLINGUAL | 11 refills | Status: AC | PRN

## 2020-03-04 MED ORDER — EZETIMIBE 10 MG PO TABS: 10.0000 mg | ORAL_TABLET | Freq: Every day | ORAL | 1 refills | Status: DC

## 2020-03-04 NOTE — Progress Notes (Signed)
Cardiology Office Note:    Date:  03/04/2020   ID:  Dawn Cunningham, DOB [birth year], MRN [id number]  PCP:  Imagene Riches, NP  Cardiologist:  Jenne Campus, MD    Referring MD: Imagene Riches, NP   Chief Complaint  Patient presents with  . Follow-up    1 MO FU   I am here to talk about the chest pain  History of Present Illness:    Dawn Cunningham is a 54 y.o. female who was referred to [place] because of atypical chest pain as well as dyspnea on exertion.  Stress test was done which showed no evidence of ischemia, however, echocardiogram showed possibility of hypokinesis involving apex raising suspicion for old anterior apical myocardial infarction as well as possible of thrombus.  We are awaiting MRI.  Overall she complained of being weak tired exhausted.  She also gets some atypical chest pain.  I wanted her to go on antianginal medication today which could be either long-acting nitroglycerin beta-blocker ranolazine, however she prefers only nitroglycerin as needed.  Therefore, I will wait for results of MRI before proceeding with potentially more aggressive management of her coronary artery disease if she truly has that problem.  Past Medical History:  Diagnosis Date  . Asthma   . Depression   . GERD (gastroesophageal reflux disease)   . Hypercholesterolemia   . Hypertension   . Hypothyroid   . Migraine     Past Surgical History:  Procedure Laterality Date  . CATARACT EXTRACTION Bilateral 2015  . CHOLECYSTECTOMY    . CLOSED REDUCTION ELBOW FRACTURE Left 02/21/2019   Procedure: OPEN REPAIR  OF ELBOW DISLOCATION;  Surgeon: Altamese Tecumseh, MD;  Location: [facility];  Service: Orthopedics;  Laterality: Left;  . EXTERNAL FIXATION ARM Left 02/21/2019   Procedure: External Fixation Arm;  Surgeon: Altamese Isola, MD;  Location: [facility];  Service: Orthopedics;  Laterality: Left;  . EXTERNAL FIXATION REMOVAL Left 03/30/2019   Procedure: REMOVAL EXTERNAL FIXATION  LEFT ELBOW;  Surgeon: Altamese Gem, MD;  Location: [facility];  Service: Orthopedics;  Laterality: Left;  . KNEE SURGERY  10/2016   scope  . PARTIAL HYSTERECTOMY     age 80    Current Medications: Current Meds  Medication Sig  . acetaminophen (TYLENOL) 650 MG CR tablet Take 1,300 mg by mouth every 8 (eight) hours as needed for pain.  Marland Kitchen aspirin EC 81 MG tablet Take 81 mg by mouth daily. Swallow whole.  Marland Kitchen buPROPion (WELLBUTRIN) 100 MG tablet Take 100 mg by mouth 2 (two) times daily.  . Calcium Carb-Cholecalciferol (OYSTER SHELL CALCIUM) 500-400 MG-UNIT TABS Take 1 tablet by mouth daily.  . Calcium-Magnesium-Vitamin D (CALCIUM 1200+D3 PO) Take 2 capsules by mouth daily.  . Cobalamin Combinations (B-12) [phone] MCG SUBL vitamin J19 4,174 mcg-folic acid 081 mcg sublingual tablet  Place by sublingual route.  . cyanocobalamin 1000 MCG tablet Take 1,000 mcg by mouth daily.  . DULoxetine (CYMBALTA) 60 MG capsule Take 120 mg by mouth daily.  Marland Kitchen HYDROcodone-acetaminophen (NORCO) 10-325 MG tablet Take 1 tablet by mouth every 6 (six) hours.  Marland Kitchen levothyroxine (SYNTHROID) 75 MCG tablet Take 37.5 mcg by mouth daily.   Marland Kitchen losartan (COZAAR) 50 MG tablet Take 50 mg by mouth at bedtime.   . magnesium gluconate (MAGONATE) 500 MG tablet Take 500 mg by mouth daily.  . niacin 500 MG tablet Take 500 mg by mouth at bedtime.  . Omega-3 Fatty Acids (FISH OIL PO) Take 1,200 mg by mouth  daily.  . pantoprazole (PROTONIX) 40 MG tablet Take 40 mg by mouth daily.  Marland Kitchen PERFLUTREN LIPID MICROSPHERE Inject 1-10 mLs into the vein as needed.  . rosuvastatin (CRESTOR) 40 MG tablet Take 40 mg by mouth at bedtime.   . traMADol (ULTRAM) 50 MG tablet Take 1-2 tablets (50-100 mg total) by mouth every 12 (twelve) hours as needed for severe pain.  Marland Kitchen zinc gluconate 50 MG tablet Take 50 mg by mouth daily.     Allergies:   Tetanus toxoids, Latex, Erythromycin, Hydrocodone, and Penicillins   Social History   Socioeconomic History  . Marital status: Married     Spouse name: Not on file  . Number of children: Not on file  . Years of education: Not on file  . Highest education level: Not on file  Occupational History  . Not on file  Tobacco Use  . Smoking status: Never Smoker  . Smokeless tobacco: Never Used  Substance and Sexual Activity  . Alcohol use: No  . Drug use: No  . Sexual activity: Not on file  Other Topics Concern  . Not on file  Social History Narrative   Lives with husband   Caffeine - 1-2 weekly   2 children   RN , [employer]   Social Determinants of Health   Financial Resource Strain:   . Difficulty of Paying Living Expenses:   Food Insecurity:   . Worried About Programme researcher, broadcasting/film/video in the Last Year:   . Barista in the Last Year:   Transportation Needs:   . Freight forwarder (Medical):   Marland Kitchen Lack of Transportation (Non-Medical):   Physical Activity:   . Days of Exercise per Week:   . Minutes of Exercise per Session:   Stress:   . Feeling of Stress :   Social Connections:   . Frequency of Communication with Friends and Family:   . Frequency of Social Gatherings with Friends and Family:   . Attends Religious Services:   . Active Member of Clubs or Organizations:   . Attends Banker Meetings:   Marland Kitchen Marital Status:      Family History: The patient's family history includes Atrial fibrillation in her mother; Cancer in her father and mother; Hypertension in her father and sister; Other in her father; Stroke in her sister. ROS:   Please see the history of present illness.    All 14 point review of systems negative except as described per history of present illness  EKGs/Labs/Other Studies Reviewed:      Recent Labs: 03/30/2019: BUN 17; Creatinine, Ser 0.94; Hemoglobin 14.4; Potassium 4.3; Sodium 138 01/30/2020: TSH 1.270  Recent Lipid Panel    Component Value Date/Time   CHOL 162 01/30/2020 1044   TRIG 173 (H) 01/30/2020 1044   HDL 42 01/30/2020 1044   CHOLHDL 3.9 01/30/2020  1044   LDLCALC 90 01/30/2020 1044    Physical Exam:    VS:  BP 123/84 (BP Location: Left Arm, Patient Position: Sitting, Cuff Size: Normal)   Pulse 90   Ht 5\' 5"  (1.651 m)   Wt 245 lb 3.2 oz (111.2 kg)   SpO2 98%   BMI 40.80 kg/m     Wt Readings from Last 3 Encounters:  03/04/20 245 lb 3.2 oz (111.2 kg)  02/20/20 243 lb (110.2 kg)  01/30/20 243 lb (110.2 kg)     GEN:  Well nourished, well developed in no acute distress HEENT: Normal NECK: No JVD;  No carotid bruits LYMPHATICS: No lymphadenopathy CARDIAC: RRR, no murmurs, no rubs, no gallops RESPIRATORY:  Clear to auscultation without rales, wheezing or rhonchi  ABDOMEN: Soft, non-tender, non-distended MUSCULOSKELETAL:  No edema; No deformity  SKIN: Warm and dry LOWER EXTREMITIES: no swelling NEUROLOGIC:  Alert and oriented x 3 PSYCHIATRIC:  Normal affect   ASSESSMENT:    1. Atypical chest pain   2. Dyslipidemia   3. Dyspnea on exertion    PLAN:    In order of problems listed above:  1. Atypical chest pain stress test negative but echocardiogram showed possibility of old MI involving apex.  We will verify this with MRI.  Also based on MRI we will discuss decide about aggressiveness of management of this problem if she truly had all my current fraction with her symptomatology cardiac catheterization will be warranted. 2. Dyslipidemia I did get her fasting lipid profile which looks much better her LDL is 90, still not on target.  I will add Zetia 10 mg to her medical regimen. 3. Dyspnea on exertion again concerns about potentially having old MI.  We will schedule her to have MRI and we waiting for it.   Medication Adjustments/Labs and Tests Ordered: Current medicines are reviewed at length with the patient today.  Concerns regarding medicines are outlined above.  No orders of the defined types were placed in this encounter.  Medication changes: No orders of the defined types were placed in this  encounter.   Signed, Georgeanna Lea, MD, [facility] 03/04/2020 2:26 PM    [facility]

## 2020-03-04 NOTE — Patient Instructions (Signed)
Medication Instructions:  Your physician has recommended you make the following change in your medication:  TAKE AS NEEDED FOR CHEST PAIN: The proper use and anticipated side effects of nitroglycerine has been carefully explained.  If a single episode of chest pain is not relieved by one tablet, the patient will try another within 5 minutes; and if this doesn't relieve the pain, the patient is instructed to call 911 for transportation to an emergency department.   START: Zetia 10 mg daily  *If you need a refill on your cardiac medications before your next appointment, please call your pharmacy*   Lab Work: None.  If you have labs (blood work) drawn today and your tests are completely normal, you will receive your results only by: Marland Kitchen MyChart Message (if you have MyChart) OR . A paper copy in the mail If you have any lab test that is abnormal or we need to change your treatment, we will call you to review the results.   Testing/Procedures: None.    Follow-Up: At [facility], you and your health needs are our priority.  As part of our continuing mission to provide you with exceptional heart care, we have created designated Provider Care Teams.  These Care Teams include your primary Cardiologist (physician) and Advanced Practice Providers (APPs -  Physician Assistants and Nurse Practitioners) who all work together to provide you with the care you need, when you need it.  We recommend signing up for the patient portal called "MyChart".  Sign up information is provided on this After Visit Summary.  MyChart is used to connect with patients for Virtual Visits (Telemedicine).  Patients are able to view lab/test results, encounter notes, upcoming appointments, etc.  Non-urgent messages can be sent to your provider as well.   To learn more about what you can do with MyChart, go to [email or web address].    Your next appointment:   1 month(s)  The format for your next appointment:   In  Person  Provider:   Gypsy Balsam, MD   Other Instructions  Nitroglycerin sublingual tablets What is this medicine? NITROGLYCERIN (nye troe GLI ser in) is a type of vasodilator. It relaxes blood vessels, increasing the blood and oxygen supply to your heart. This medicine is used to relieve chest pain caused by angina. It is also used to prevent chest pain before activities like climbing stairs, going outdoors in cold weather, or sexual activity. This medicine may be used for other purposes; ask your health care provider or pharmacist if you have questions. COMMON BRAND NAME(S): Nitroquick, Nitrostat, Nitrotab What should I tell my health care provider before I take this medicine? They need to know if you have any of these conditions:  anemia  head injury, recent stroke, or bleeding in the brain  liver disease  previous heart attack  an unusual or allergic reaction to nitroglycerin, other medicines, foods, dyes, or preservatives  pregnant or trying to get pregnant  breast-feeding How should I use this medicine? Take this medicine by mouth as needed. At the first sign of an angina attack (chest pain or tightness) place one tablet under your tongue. You can also take this medicine 5 to 10 minutes before an event likely to produce chest pain. Follow the directions on the prescription label. Let the tablet dissolve under the tongue. Do not swallow whole. Replace the dose if you accidentally swallow it. It will help if your mouth is not dry. Saliva around the tablet will help it to dissolve more  quickly. Do not eat or drink, smoke or chew tobacco while a tablet is dissolving. If you are not better within 5 minutes after taking ONE dose of nitroglycerin, call 9-1-1 immediately to seek emergency medical care. Do not take more than 3 nitroglycerin tablets over 15 minutes. If you take this medicine often to relieve symptoms of angina, your doctor or health care professional may provide you  with different instructions to manage your symptoms. If symptoms do not go away after following these instructions, it is important to call 9-1-1 immediately. Do not take more than 3 nitroglycerin tablets over 15 minutes. Talk to your pediatrician regarding the use of this medicine in children. Special care may be needed. Overdosage: If you think you have taken too much of this medicine contact a poison control center or emergency room at once. NOTE: This medicine is only for you. Do not share this medicine with others. What if I miss a dose? This does not apply. This medicine is only used as needed. What may interact with this medicine? Do not take this medicine with any of the following medications:  certain migraine medicines like ergotamine and dihydroergotamine (DHE)  medicines used to treat erectile dysfunction like sildenafil, tadalafil, and vardenafil  riociguat This medicine may also interact with the following medications:  alteplase  aspirin  heparin  medicines for high blood pressure  medicines for mental depression  other medicines used to treat angina  phenothiazines like chlorpromazine, mesoridazine, prochlorperazine, thioridazine This list may not describe all possible interactions. Give your health care provider a list of all the medicines, herbs, non-prescription drugs, or dietary supplements you use. Also tell them if you smoke, drink alcohol, or use illegal drugs. Some items may interact with your medicine. What should I watch for while using this medicine? Tell your doctor or health care professional if you feel your medicine is no longer working. Keep this medicine with you at all times. Sit or lie down when you take your medicine to prevent falling if you feel dizzy or faint after using it. Try to remain calm. This will help you to feel better faster. If you feel dizzy, take several deep breaths and lie down with your feet propped up, or bend forward with your  head resting between your knees. You may get drowsy or dizzy. Do not drive, use machinery, or do anything that needs mental alertness until you know how this drug affects you. Do not stand or sit up quickly, especially if you are an older patient. This reduces the risk of dizzy or fainting spells. Alcohol can make you more drowsy and dizzy. Avoid alcoholic drinks. Do not treat yourself for coughs, colds, or pain while you are taking this medicine without asking your doctor or health care professional for advice. Some ingredients may increase your blood pressure. What side effects may I notice from receiving this medicine? Side effects that you should report to your doctor or health care professional as soon as possible:  blurred vision  dry mouth  skin rash  sweating  the feeling of extreme pressure in the head  unusually weak or tired Side effects that usually do not require medical attention (report to your doctor or health care professional if they continue or are bothersome):  flushing of the face or neck  headache  irregular heartbeat, palpitations  nausea, vomiting This list may not describe all possible side effects. Call your doctor for medical advice about side effects. You may report side effects to  FDA at 1-800-FDA-1088. Where should I keep my medicine? Keep out of the reach of children. Store at room temperature between 20 and 25 degrees C (68 and 77 degrees F). Store in Chief of Staff. Protect from light and moisture. Keep tightly closed. Throw away any unused medicine after the expiration date. NOTE: This sheet is a summary. It may not cover all possible information. If you have questions about this medicine, talk to your doctor, pharmacist, or health care provider.  2020 Elsevier/Gold Standard (2013-06-29 17:57:36)  Ezetimibe Tablets What is this medicine? EZETIMIBE (ez ET i mibe) blocks the absorption of cholesterol from the stomach. It can help lower blood  cholesterol for patients who are at risk of getting heart disease or a stroke. It is only for patients whose cholesterol level is not controlled by diet. This medicine may be used for other purposes; ask your health care provider or pharmacist if you have questions. COMMON BRAND NAME(S): Zetia What should I tell my health care provider before I take this medicine? They need to know if you have any of these conditions:  liver disease  an unusual or allergic reaction to ezetimibe, medicines, foods, dyes, or preservatives  pregnant or trying to get pregnant  breast-feeding How should I use this medicine? Take this medicine by mouth with a glass of water. Follow the directions on the prescription label. This medicine can be taken with or without food. Take your doses at regular intervals. Do not take your medicine more often than directed. Talk to your pediatrician regarding the use of this medicine in children. Special care may be needed. Overdosage: If you think you have taken too much of this medicine contact a poison control center or emergency room at once. NOTE: This medicine is only for you. Do not share this medicine with others. What if I miss a dose? If you miss a dose, take it as soon as you can. If it is almost time for your next dose, take only that dose. Do not take double or extra doses. What may interact with this medicine? Do not take this medicine with any of the following medications:  fenofibrate  gemfibrozil This medicine may also interact with the following medications:  antacids  cyclosporine  herbal medicines like red yeast rice  other medicines to lower cholesterol or triglycerides This list may not describe all possible interactions. Give your health care provider a list of all the medicines, herbs, non-prescription drugs, or dietary supplements you use. Also tell them if you smoke, drink alcohol, or use illegal drugs. Some items may interact with your  medicine. What should I watch for while using this medicine? Visit your doctor or health care professional for regular checks on your progress. You will need to have your cholesterol levels checked. If you are also taking some other cholesterol medicines, you will also need to have tests to make sure your liver is working properly. Tell your doctor or health care professional if you get any unexplained muscle pain, tenderness, or weakness, especially if you also have a fever and tiredness. You need to follow a low-cholesterol, low-fat diet while you are taking this medicine. This will decrease your risk of getting heart and blood vessel disease. Exercising and avoiding alcohol and smoking can also help. Ask your doctor or dietician for advice. What side effects may I notice from receiving this medicine? Side effects that you should report to your doctor or health care professional as soon as possible:  allergic reactions like  skin rash, itching or hives, swelling of the face, lips, or tongue  dark yellow or brown urine  unusually weak or tired  yellowing of the skin or eyes Side effects that usually do not require medical attention (report to your doctor or health care professional if they continue or are bothersome):  diarrhea  dizziness  headache  stomach upset or pain This list may not describe all possible side effects. Call your doctor for medical advice about side effects. You may report side effects to FDA at 1-800-FDA-1088. Where should I keep my medicine? Keep out of the reach of children. Store at room temperature between 15 and 30 degrees C (59 and 86 degrees F). Protect from moisture. Keep container tightly closed. Throw away any unused medicine after the expiration date. NOTE: This sheet is a summary. It may not cover all possible information. If you have questions about this medicine, talk to your doctor, pharmacist, or health care provider.  2020 Elsevier/Gold Standard  (2012-03-07 15:39:09)

## 2020-03-06 ENCOUNTER — Other Ambulatory Visit: Payer: Self-pay | Admitting: Cardiology

## 2020-03-06 MED ORDER — EZETIMIBE 10 MG PO TABS: 10.0000 mg | ORAL_TABLET | Freq: Every day | ORAL | 1 refills | Status: AC

## 2020-03-06 NOTE — Telephone Encounter (Signed)
Refill sent in per request.  

## 2020-03-06 NOTE — Telephone Encounter (Signed)
*  STAT* If patient is at the pharmacy, call can be transferred to refill team.   1. Which medications need to be refilled? (please list name of each medication and dose if known) ezetimibe (ZETIA) 10 MG tablet / nitroGLYCERIN (NITROSTAT) 0.4 MG SL tablet  2. Which pharmacy/location (including street and city if local pharmacy) is medication to be sent to?  [facility], [address] [place], [place] [address]  3. Do they need a 30 day or 90 day supply? 90   Refill was sent to [facility] but she states she does not use that pharmacy.

## 2020-03-22 ENCOUNTER — Telehealth (HOSPITAL_COMMUNITY): Payer: Self-pay | Admitting: *Deleted

## 2020-03-22 NOTE — Telephone Encounter (Signed)
Attempted to call patient regarding upcoming cardiac MRI appointment. Left message on voicemail with name and callback number  Godric Lavell Tai RN Navigator Cardiac Imaging [facility] [phone] Office [phone] Cell  

## 2020-03-25 ENCOUNTER — Other Ambulatory Visit: Payer: Self-pay

## 2020-03-25 ENCOUNTER — Ambulatory Visit (HOSPITAL_COMMUNITY)
Admission: RE | Admit: 2020-03-25 | Discharge: 2020-03-25 | Disposition: A | Discharge status: Home / Self Care | Payer: BC Managed Care – PPO | Source: Ambulatory Visit | Attending: Cardiology | Admitting: Cardiology

## 2020-03-25 DIAGNOSIS — I513 Intracardiac thrombosis, not elsewhere classified: Secondary | ICD-10-CM | POA: Diagnosis present

## 2020-03-25 DIAGNOSIS — R9430 Abnormal result of cardiovascular function study, unspecified: Secondary | ICD-10-CM

## 2020-03-25 MED ORDER — GADOBUTROL 1 MMOL/ML IV SOLN
10.0000 mL | Freq: Once | INTRAVENOUS | Status: AC | PRN
  Administered 2020-03-25: 10 mL via INTRAVENOUS

## 2020-04-02 ENCOUNTER — Ambulatory Visit (INDEPENDENT_AMBULATORY_CARE_PROVIDER_SITE_OTHER): Payer: BC Managed Care – PPO | Admitting: Cardiology

## 2020-04-02 ENCOUNTER — Encounter: Payer: Self-pay | Admitting: Cardiology

## 2020-04-02 ENCOUNTER — Other Ambulatory Visit: Payer: Self-pay

## 2020-04-02 VITALS — BP 130/80 | HR 90 | Ht 65.0 in | Wt 247.4 lb

## 2020-04-02 DIAGNOSIS — R0789 Other chest pain: Secondary | ICD-10-CM | POA: Diagnosis not present

## 2020-04-02 DIAGNOSIS — E785 Hyperlipidemia, unspecified: Secondary | ICD-10-CM | POA: Diagnosis not present

## 2020-04-02 DIAGNOSIS — E039 Hypothyroidism, unspecified: Secondary | ICD-10-CM | POA: Diagnosis not present

## 2020-04-02 DIAGNOSIS — R0600 Dyspnea, unspecified: Secondary | ICD-10-CM

## 2020-04-02 DIAGNOSIS — R0609 Other forms of dyspnea: Secondary | ICD-10-CM

## 2020-04-02 NOTE — Patient Instructions (Signed)

## 2020-04-02 NOTE — Progress Notes (Signed)
Cardiology Office Note:    Date:  04/02/2020   ID:  Dawn Cunningham, DOB 06/02/1966, MRN [id number]  PCP:  Dema Severin, NP  Cardiologist:  Gypsy Balsam, MD    Referring MD: Dema Severin, NP   No chief complaint on file. I am feeling the same  History of Present Illness:    Dawn Cunningham is a 54 y.o. female she was referred to [place] because of atypical chest pain as well as dyspnea on exertion.  Stress test was done which showed no evidence ischemia, however echocardiogram showed possibility of hypokinesis involving apex raising suspicion for old anterior septal wall MI.  There was also some question about possible thrombus.  After that we did MRI to rule out the seizure and likely MRI came back normal.  There was no segmental wall motion abnormalities there was no evidence of MI there was no evidence of LV thrombus.  She is still complaining of feeling weak tired exhausted.  However she also admits the fact that she does have difficult work schedule.  She few days works second shift and then a first shift and that really drain her out.  Denies having the typical chest pain tightness squeezing pressure burning chest.  She just recently started CPAP mask.  Cannot really tell if it helps her tremendously but overall happy that she does have an equipment and use it.  Past Medical History:  Diagnosis Date  . Asthma   . Depression   . GERD (gastroesophageal reflux disease)   . Hypercholesterolemia   . Hypertension   . Hypothyroid   . Migraine     Past Surgical History:  Procedure Laterality Date  . CATARACT EXTRACTION Bilateral 2015  . CHOLECYSTECTOMY    . CLOSED REDUCTION ELBOW FRACTURE Left 02/21/2019   Procedure: OPEN REPAIR  OF ELBOW DISLOCATION;  Surgeon: Myrene Galas, MD;  Location: [facility] OR;  Service: Orthopedics;  Laterality: Left;  . EXTERNAL FIXATION ARM Left 02/21/2019   Procedure: External Fixation Arm;  Surgeon: Myrene Galas, MD;  Location: [facility] OR;  Service: Orthopedics;   Laterality: Left;  . EXTERNAL FIXATION REMOVAL Left 03/30/2019   Procedure: REMOVAL EXTERNAL FIXATION  LEFT ELBOW;  Surgeon: Myrene Galas, MD;  Location: [facility] OR;  Service: Orthopedics;  Laterality: Left;  . KNEE SURGERY  10/2016   scope  . PARTIAL HYSTERECTOMY     age 67    Current Medications: Current Meds  Medication Sig  . acetaminophen (TYLENOL) 650 MG CR tablet Take 1,300 mg by mouth every 8 (eight) hours as needed for pain.  Marland Kitchen aspirin EC 81 MG tablet Take 81 mg by mouth daily. Swallow whole.  Marland Kitchen buPROPion (WELLBUTRIN) 100 MG tablet Take 100 mg by mouth 2 (two) times daily.  . Calcium Carb-Cholecalciferol (OYSTER SHELL CALCIUM) 500-400 MG-UNIT TABS Take 1 tablet by mouth daily.  . Calcium-Magnesium-Vitamin D (CALCIUM 1200+D3 PO) Take 2 capsules by mouth daily.  . Cobalamin Combinations (B-12) [phone] MCG SUBL vitamin B12 1,000 mcg-folic acid 400 mcg sublingual tablet  Place by sublingual route.  . cyanocobalamin 1000 MCG tablet Take 1,000 mcg by mouth daily.  . DULoxetine (CYMBALTA) 60 MG capsule Take 120 mg by mouth daily.  Marland Kitchen ezetimibe (ZETIA) 10 MG tablet Take 1 tablet (10 mg total) by mouth daily.  Marland Kitchen HYDROcodone-acetaminophen (NORCO) 10-325 MG tablet Take 1 tablet by mouth every 6 (six) hours.  Marland Kitchen levothyroxine (SYNTHROID) 75 MCG tablet Take 37.5 mcg by mouth daily.   Marland Kitchen losartan (COZAAR) 50 MG tablet  Take 50 mg by mouth at bedtime.   . magnesium gluconate (MAGONATE) 500 MG tablet Take 500 mg by mouth daily.  . niacin 500 MG tablet Take 500 mg by mouth at bedtime.  . nitroGLYCERIN (NITROSTAT) 0.4 MG SL tablet Place 1 tablet (0.4 mg total) under the tongue every 5 (five) minutes as needed.  . Omega-3 Fatty Acids (FISH OIL PO) Take 1,200 mg by mouth daily.  . pantoprazole (PROTONIX) 40 MG tablet Take 40 mg by mouth daily.  Marland Kitchen PERFLUTREN LIPID MICROSPHERE Inject 1-10 mLs into the vein as needed.  . rosuvastatin (CRESTOR) 40 MG tablet Take 40 mg by mouth at bedtime.   . traMADol  (ULTRAM) 50 MG tablet Take 1-2 tablets (50-100 mg total) by mouth every 12 (twelve) hours as needed for severe pain.  Marland Kitchen zinc gluconate 50 MG tablet Take 50 mg by mouth daily.     Allergies:   Tetanus toxoids, Latex, Erythromycin, Hydrocodone, and Penicillins   Social History   Socioeconomic History  . Marital status: Married    Spouse name: Not on file  . Number of children: Not on file  . Years of education: Not on file  . Highest education level: Not on file  Occupational History  . Not on file  Tobacco Use  . Smoking status: Never Smoker  . Smokeless tobacco: Never Used  Substance and Sexual Activity  . Alcohol use: No  . Drug use: No  . Sexual activity: Not on file  Other Topics Concern  . Not on file  Social History Narrative   Lives with husband   Caffeine - 1-2 weekly   2 children   RN , [employer]   Social Determinants of Health   Financial Resource Strain:   . Difficulty of Paying Living Expenses:   Food Insecurity:   . Worried About Programme researcher, broadcasting/film/video in the Last Year:   . Barista in the Last Year:   Transportation Needs:   . Freight forwarder (Medical):   Marland Kitchen Lack of Transportation (Non-Medical):   Physical Activity:   . Days of Exercise per Week:   . Minutes of Exercise per Session:   Stress:   . Feeling of Stress :   Social Connections:   . Frequency of Communication with Friends and Family:   . Frequency of Social Gatherings with Friends and Family:   . Attends Religious Services:   . Active Member of Clubs or Organizations:   . Attends Banker Meetings:   Marland Kitchen Marital Status:      Family History: The patient's family history includes Atrial fibrillation in her mother; Cancer in her father and mother; Hypertension in her father and sister; Other in her father; Stroke in her sister. ROS:   Please see the history of present illness.    All 14 point review of systems negative except as described per history of  present illness  EKGs/Labs/Other Studies Reviewed:      Recent Labs: 01/30/2020: TSH 1.270  Recent Lipid Panel    Component Value Date/Time   CHOL 162 01/30/2020 1044   TRIG 173 (H) 01/30/2020 1044   HDL 42 01/30/2020 1044   CHOLHDL 3.9 01/30/2020 1044   LDLCALC 90 01/30/2020 1044    Physical Exam:    VS:  BP 130/80 (BP Location: Left Arm, Patient Position: Sitting, Cuff Size: Large)   Pulse 90   Ht 5\' 5"  (1.651 m)   Wt 247 lb 6.4 oz (112.2  kg)   SpO2 93%   BMI 41.17 kg/m     Wt Readings from Last 3 Encounters:  04/02/20 247 lb 6.4 oz (112.2 kg)  03/04/20 245 lb 3.2 oz (111.2 kg)  02/20/20 243 lb (110.2 kg)     GEN:  Well nourished, well developed in no acute distress HEENT: Normal NECK: No JVD; No carotid bruits LYMPHATICS: No lymphadenopathy CARDIAC: RRR, no murmurs, no rubs, no gallops RESPIRATORY:  Clear to auscultation without rales, wheezing or rhonchi  ABDOMEN: Soft, non-tender, non-distended MUSCULOSKELETAL:  No edema; No deformity  SKIN: Warm and dry LOWER EXTREMITIES: no swelling NEUROLOGIC:  Alert and oriented x 3 PSYCHIATRIC:  Normal affect   ASSESSMENT:    1. Atypical chest pain   2. Dyslipidemia   3. Dyspnea on exertion   4. Acquired hypothyroidism    PLAN:    In order of problems listed above:  1. Atypical chest pain.  Stress test negative, MRI reviewed showing no evidence of old MI.  There is no evidence of thrombus.  We will continue risk factors modification which include continuation of aspirin as well as proper management of dyslipidemia.  She is on Crestor 40 as well as Zetia 10. 2. Dyslipidemia as above. 3. Dyspnea on exertion probably multifactorial.  I encouraged her to be L be more active and hopefully that will improve the situation.  Likely ejection fraction is normal. 4. Hypothyroidism bed followed by internal medicine team.  I did review her K PN I have her TSH from 01/30/2020 showing 1.27 which is normal.  I asked her to  follow-up with her primary care physician to look for another explanation of her weakness fatigue tiredness as well as atypical chest pain.  I will see her back in my office in about 3 months to see how she does.   Medication Adjustments/Labs and Tests Ordered: Current medicines are reviewed at length with the patient today.  Concerns regarding medicines are outlined above.  No orders of the defined types were placed in this encounter.  Medication changes: No orders of the defined types were placed in this encounter.   Signed, Georgeanna Lea, MD, [facility] 04/02/2020 10:58 AM    [facility]

## 2020-04-29 ENCOUNTER — Telehealth: Payer: BC Managed Care – PPO | Admitting: Internal Medicine

## 2020-07-01 ENCOUNTER — Ambulatory Visit: Payer: BC Managed Care – PPO | Admitting: Cardiology

## 2020-07-03 ENCOUNTER — Ambulatory Visit: Payer: BC Managed Care – PPO | Admitting: Internal Medicine

## 2020-07-05 ENCOUNTER — Encounter: Payer: Self-pay | Admitting: General Practice

## 2020-08-03 IMAGING — DX LEFT FOREARM - 2 VIEW
2 series · 2 of 2 positions shown · non-contrast
Comparison: None.

CLINICAL DATA: Pain

EXAM:
LEFT FOREARM - 2 VIEW

[forearm ap]
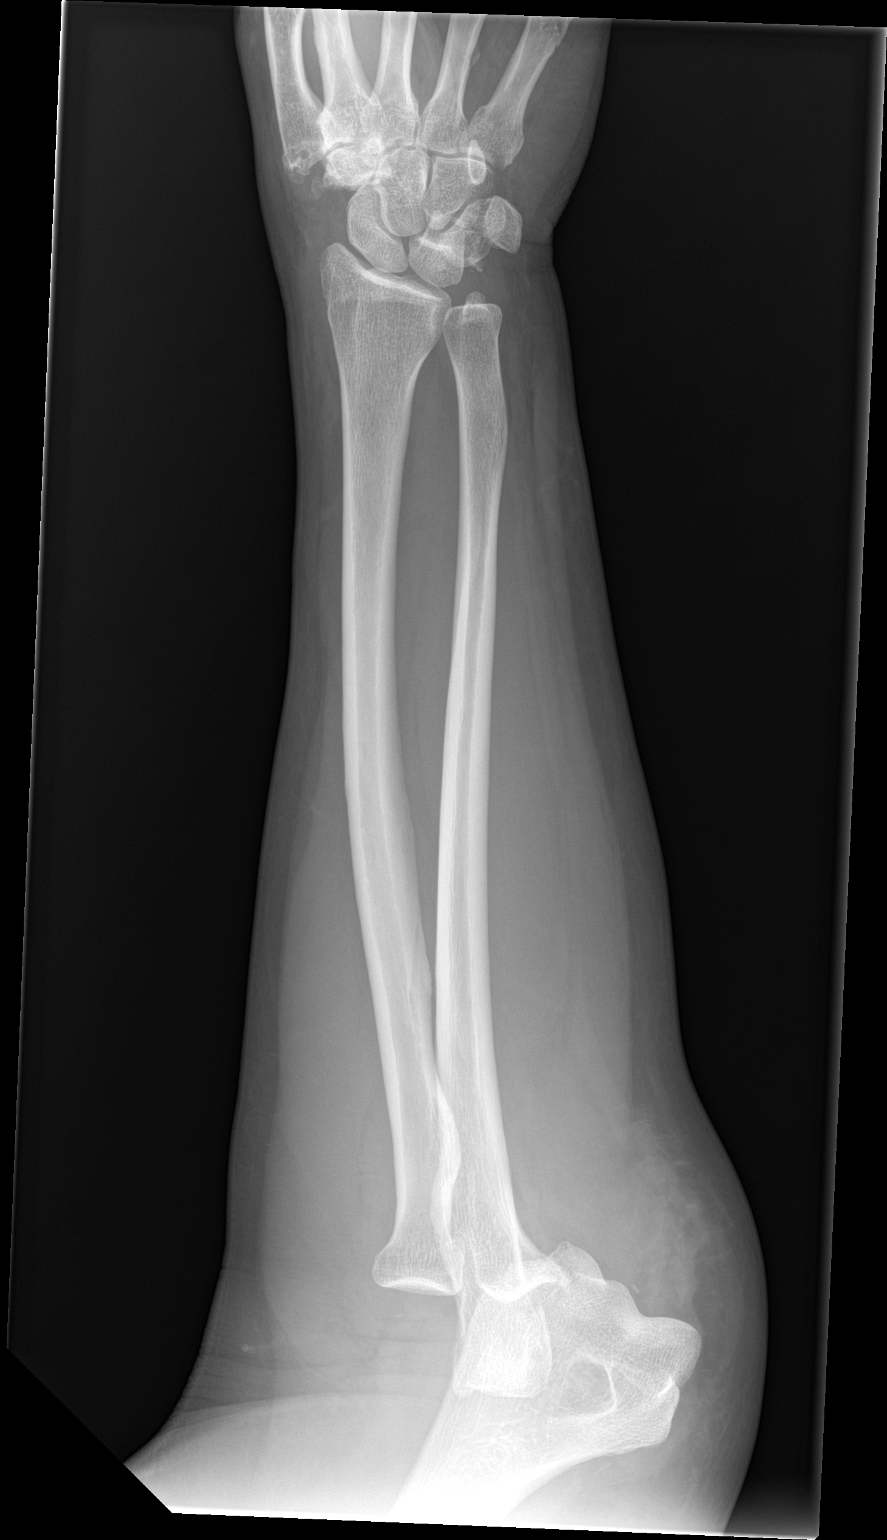

[forearm lat]
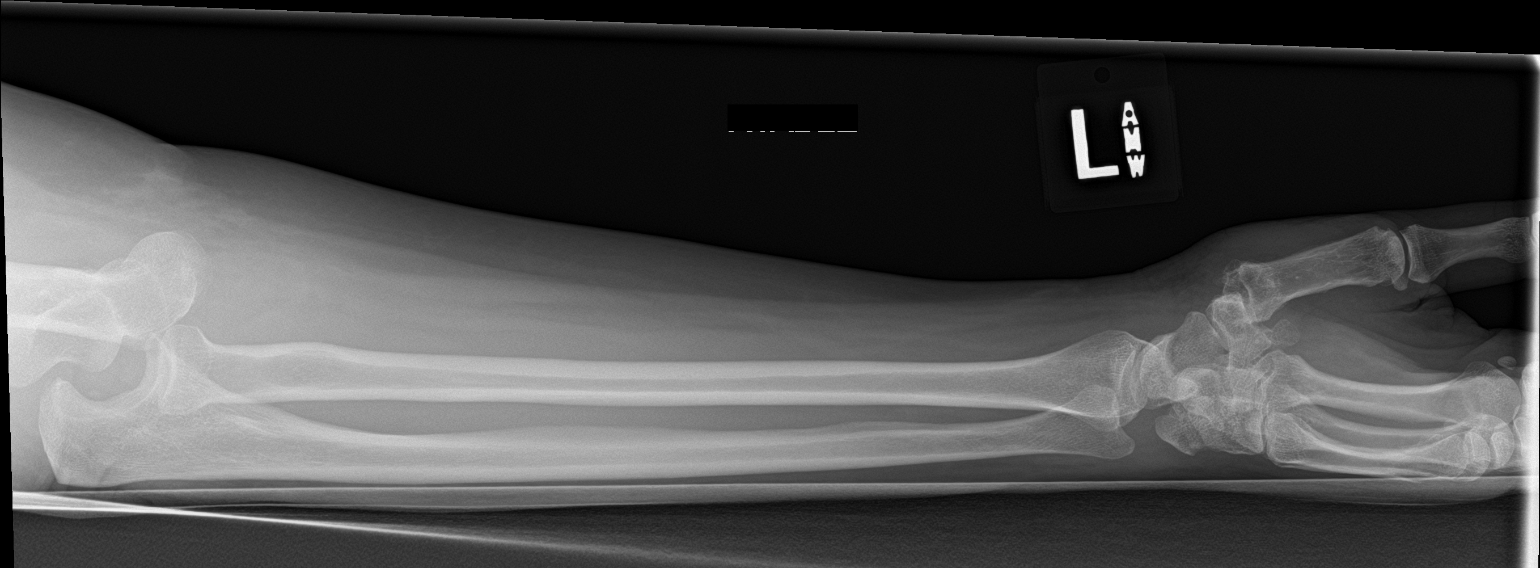

[2 of 2 positions shown; findings below may reference images not displayed]

FINDINGS: Again identified is an elbow dislocation. On the images provided.
The humerus appears to be displaced anteriorly and towards the ulna.
There is surrounding soft tissue swelling. A few osseous fragments
are again noted. There is no fracture identified involving the
distal radius or ulna.
IMPRESSION: 1. Elbow dislocation as above.
2. Surrounding soft tissue swelling.
3. Small osseous fragments are again noted about the elbow which may
represent acute fracture fragments. Post reduction radiographs are
recommended.

## 2020-08-03 IMAGING — DX LEFT ELBOW - COMPLETE 3+ VIEW
1 series · 5 of 5 positions shown · non-contrast
Comparison: 01/16/2019

CLINICAL DATA: Post reduction radiographs.

EXAM:
LEFT ELBOW - COMPLETE 3+ VIEW

[Series 1: elbow · 0.14mm/px · 5 of 5 slices shown]
[im 1/5]
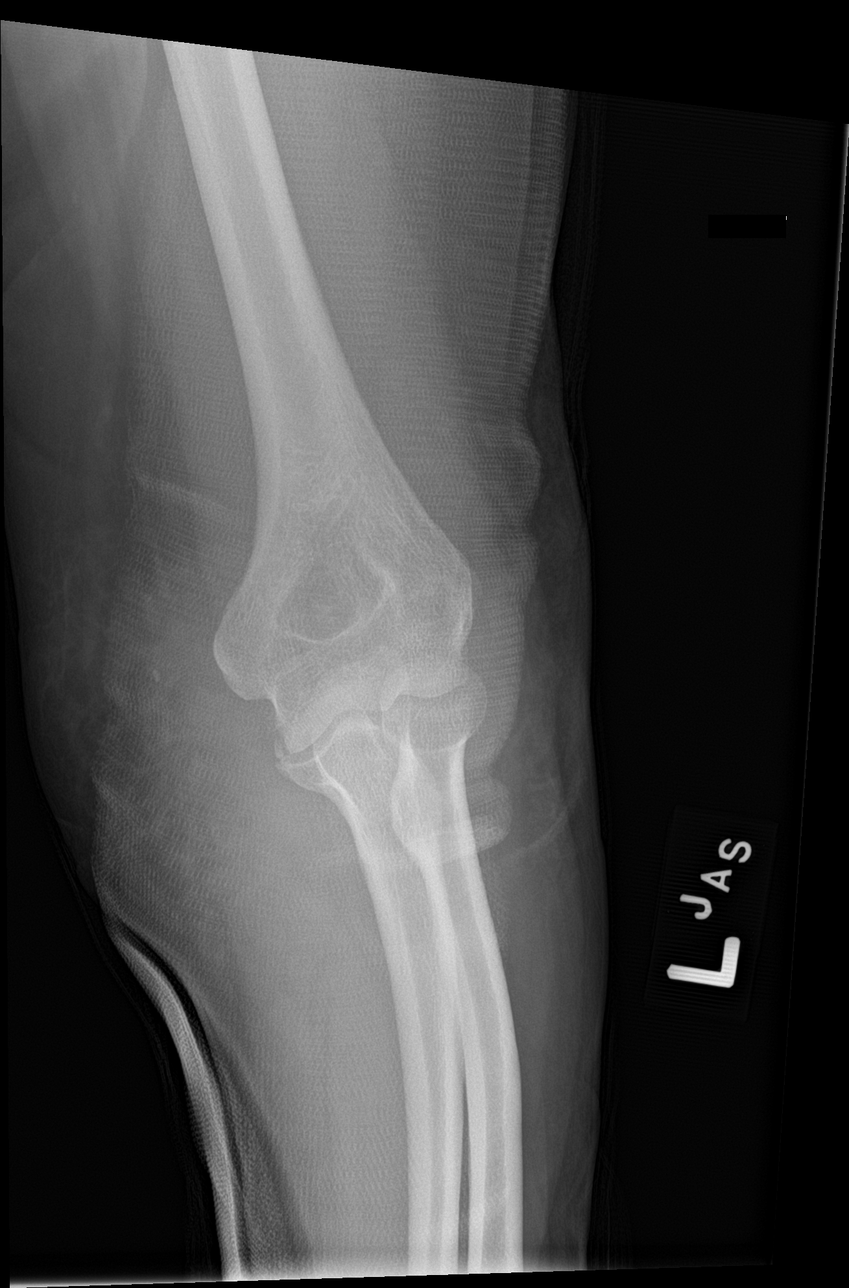
[im 2/5]
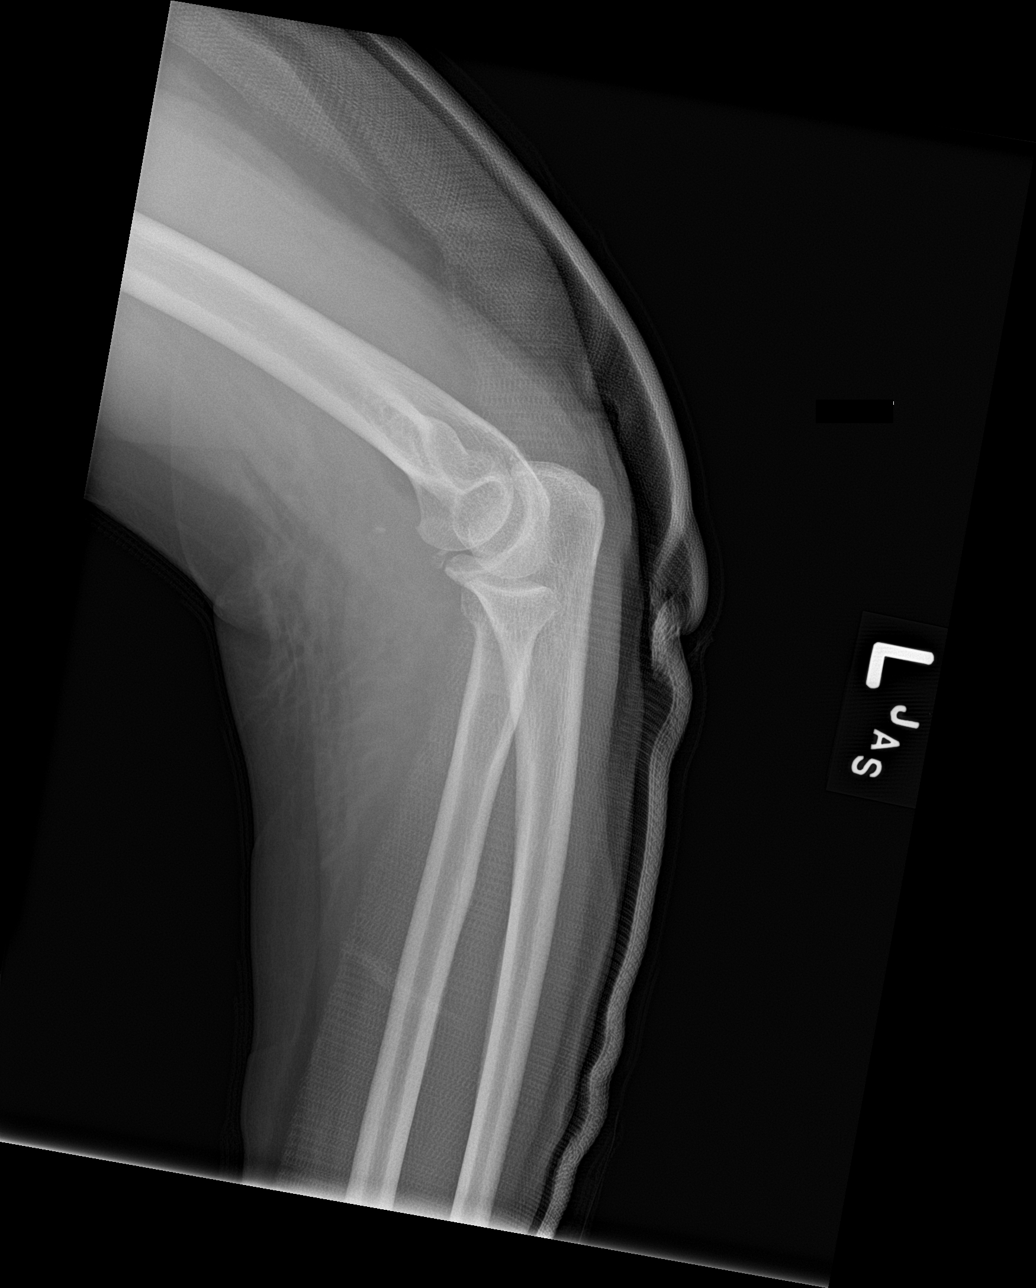
[im 3/5]
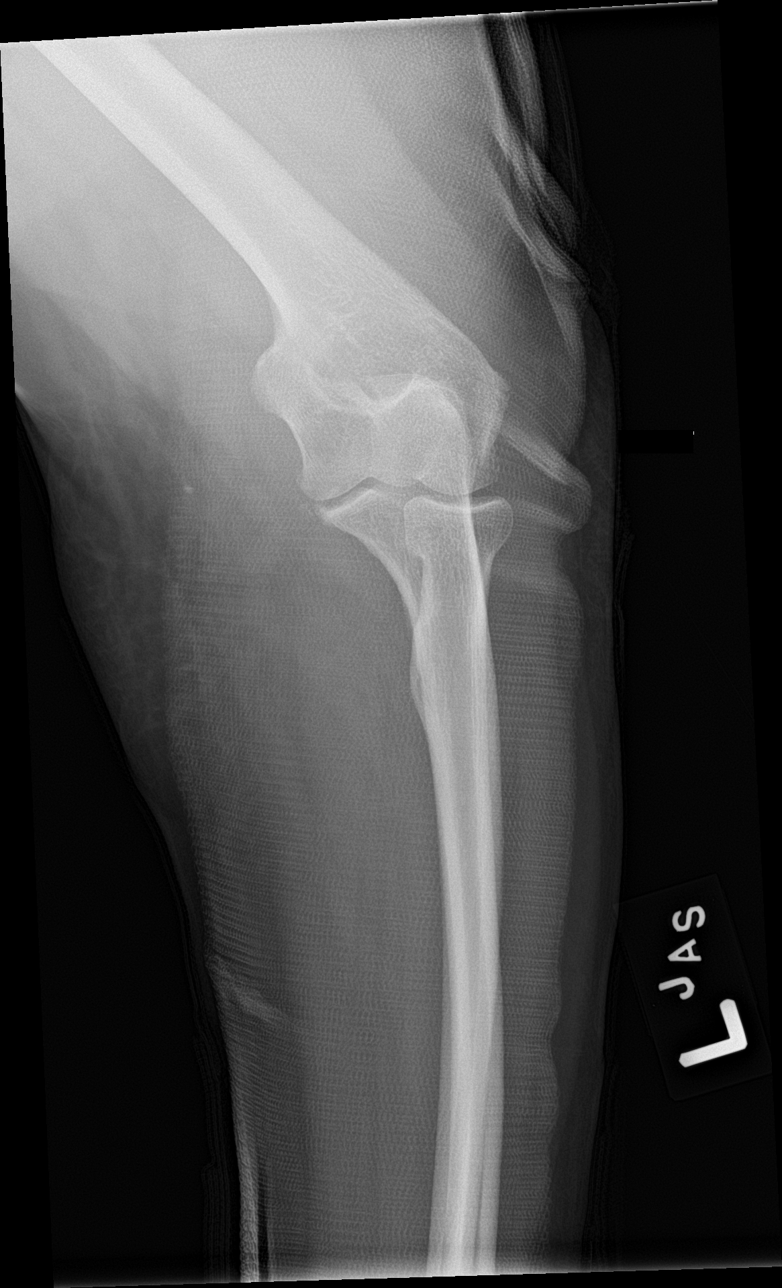
[im 4/5]
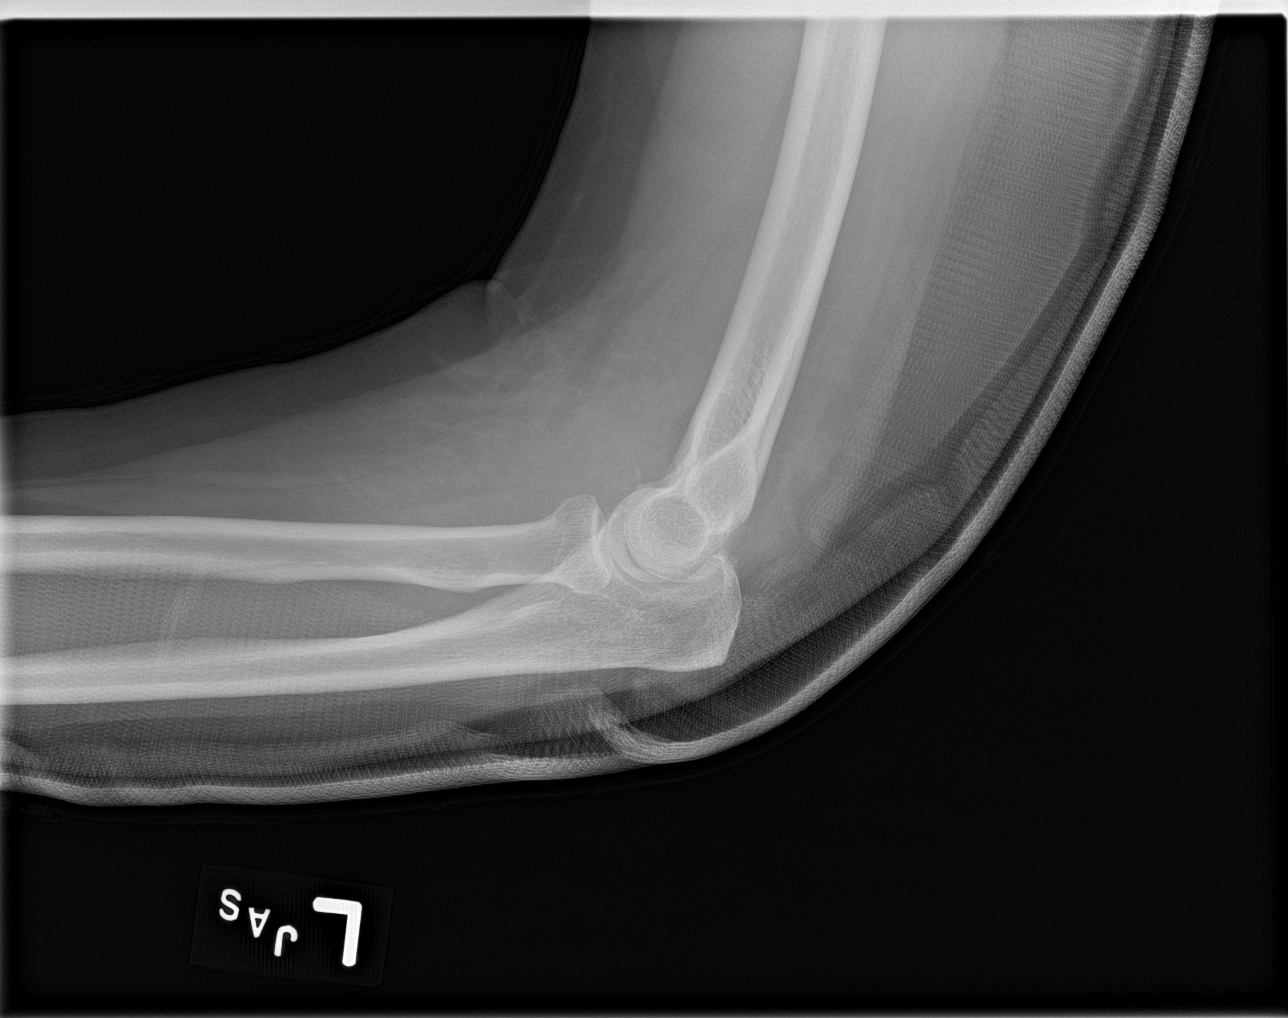
[im 5/5]
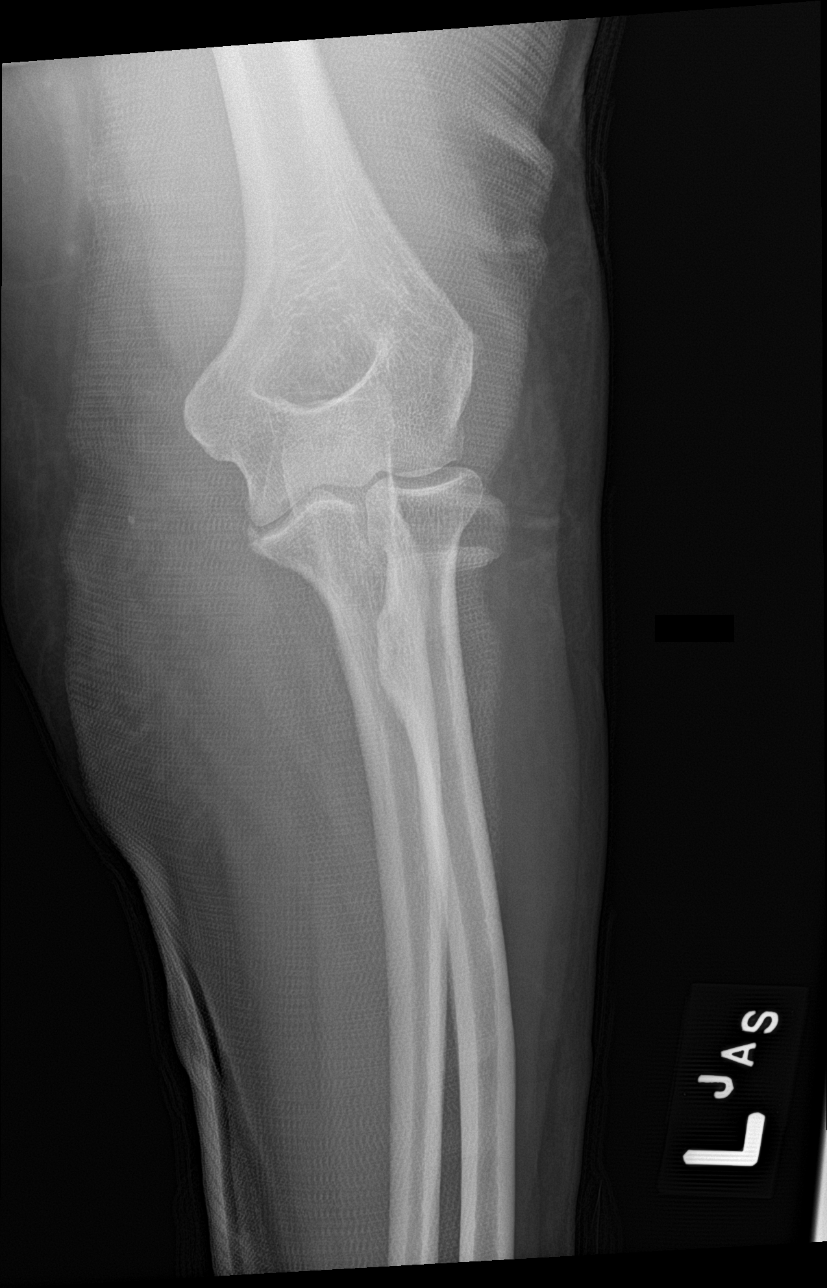

[5 of 5 positions shown; findings below may reference images not displayed]

FINDINGS: Evaluation is limited by patient positioning and an overlying
fiberglass cast. The alignment appears improved. There is a large
joint effusion. There are few surrounding osseous fragments, for
example adjacent to the coronoid process, concerning for acute
fracture fragments.
IMPRESSION: 1. Status post reduction of the previously demonstrated elbow
dislocation with significantly improved osseous alignment.
2. Large joint effusion.
3. Multiple small osseous fragments are again noted, suspicious for
associated acute fractures, especially of the coronoid process.
4. Surrounding soft tissue swelling.

## 2021-09-10 IMAGING — MR MR CARD MORPHOLOGY WO/W CM
45 of 48 series · 45 of 48 positions shown · IV contrast (Contrast agent)
Comparison: none

CLINICAL DATA: Abnormal echo?  RWMA

EXAM:
CARDIAC MRI
TECHNIQUE: The patient was scanned on a 1.5 Tesla GE magnet. A dedicated
cardiac coil was used. Functional imaging was done using Fiesta
sequences. [DATE], and 4 chamber views were done to assess for RWMA's.
Modified Viktoriya rule using a short axis stack was used to
calculate an ejection fraction on a dedicated work station using
Circle software. The patient received 10 cc of Gadavist. After 10
minutes inversion recovery sequences were used to assess for
infiltration and scar tissue.
CONTRAST:  Gadavist

[Series 4: t2_haste_db_tra_bh · axial · 8.0mm · 1.33mm/px · 1 of 5 slices shown (1 of 2)]
[im 1/5]
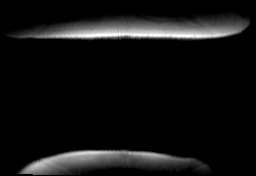

[Series 5: t2_haste_db_tra_bh · axial · 8.0mm · 1.48mm/px · 1 of 16 slices shown (2 of 2)]
[im 1/16]
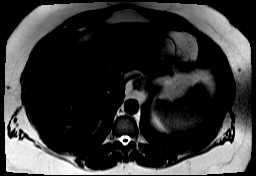

[Series 9: bSSFP · oblique · 8.0mm · 1.61mm/px · 1 of 25 slices shown (1 of 26)]
[im 1/25]
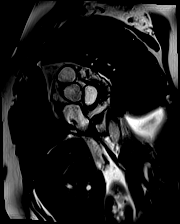

[Series 11: bSSFP · oblique · 8.0mm · 1.61mm/px · 1 of 25 slices shown (2 of 26)]
[im 1/25]
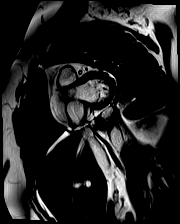

[Series 12: bSSFP · oblique · 8.0mm · 1.61mm/px · 1 of 25 slices shown (3 of 26)]
[im 1/25]
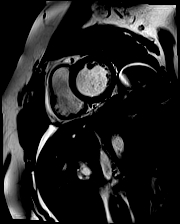

[Series 13: bSSFP · oblique · 8.0mm · 1.61mm/px · 1 of 25 slices shown (4 of 26)]
[im 1/25]
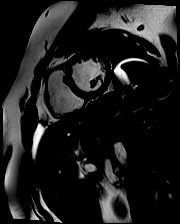

[Series 14: bSSFP · oblique · 8.0mm · 1.61mm/px · 1 of 25 slices shown (5 of 26)]
[im 1/25]
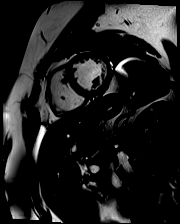

[Series 15: bSSFP · oblique · 8.0mm · 1.61mm/px · 1 of 25 slices shown (6 of 26)]
[im 1/25]
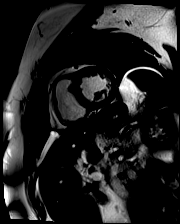

[Series 16: bSSFP · oblique · 8.0mm · 1.61mm/px · 1 of 25 slices shown (7 of 26)]
[im 1/25]
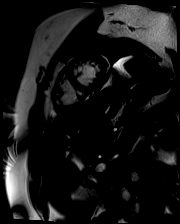

[Series 17: bSSFP · oblique · 8.0mm · 1.61mm/px · 1 of 25 slices shown (8 of 26)]
[im 1/25]
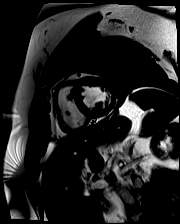

[Series 18: bSSFP · oblique · 8.0mm · 1.61mm/px · 1 of 25 slices shown (9 of 26)]
[im 1/25]
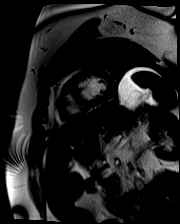

[Series 19: bSSFP · oblique · 8.0mm · 1.61mm/px · 1 of 25 slices shown (10 of 26)]
[im 1/25]
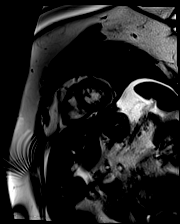

[Series 20: bSSFP · oblique · 8.0mm · 1.61mm/px · 1 of 25 slices shown (11 of 26)]
[im 1/25]
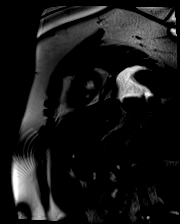

[Series 21: bSSFP · oblique · 8.0mm · 1.61mm/px · 1 of 25 slices shown (12 of 26)]
[im 1/25]
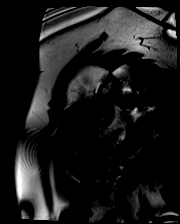

[Series 22: bSSFP · oblique · 8.0mm · 1.61mm/px · 1 of 25 slices shown (13 of 26)]
[im 1/25]
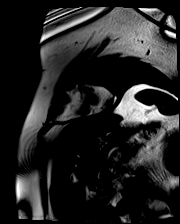

[Series 23: bSSFP · oblique · 8.0mm · 1.61mm/px · 1 of 25 slices shown (14 of 26)]
[im 1/25]
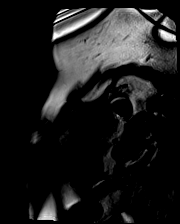

[Series 24: bSSFP · oblique · 8.0mm · 1.61mm/px · 1 of 25 slices shown (15 of 26)]
[im 1/25]
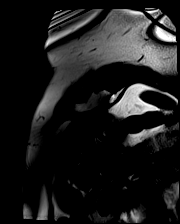

[Series 25: bSSFP · oblique · 8.0mm · 1.61mm/px · 1 of 25 slices shown (16 of 26)]
[im 1/25]
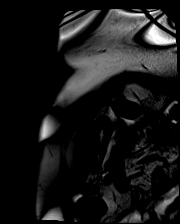

[Series 26: bSSFP · oblique · 8.0mm · 1.61mm/px · 1 of 25 slices shown (17 of 26)]
[im 1/25]
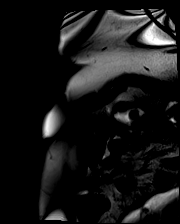

[Series 27: bSSFP · oblique · 8.0mm · 1.61mm/px · 1 of 25 slices shown (18 of 26)]
[im 1/25]
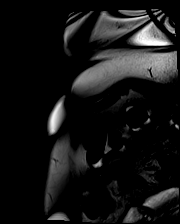

[Series 28: (id)_long_t1 · oblique · 8.0mm · 1.41mm/px · 1 of 24 slices shown]
[im 1/24]
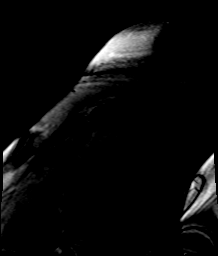

[Series 29: (id)_long_t1_moco · oblique · 8.0mm · 1.41mm/px · 1 of 24 slices shown]
[im 1/24]
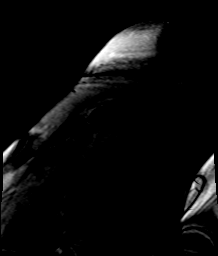

[Series 30: (id)_long_t1_moco_t1 · oblique · 8.0mm · 1.41mm/px · 1 of 6 slices shown]
[im 1/6]
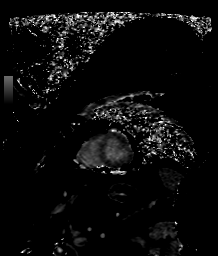

[Series 32: (id)_trufi · oblique · 8.0mm · 1.88mm/px · 1 of 9 slices shown]
[im 1/9]
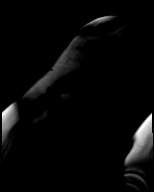

[Series 33: (id)_trufi_moco · oblique · 8.0mm · 1.88mm/px · 1 of 9 slices shown]
[im 1/9]
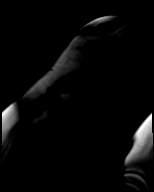

[Series 34: (id)_trufi_moco_t2 · oblique · 8.0mm · 1.88mm/px · 1 of 3 slices shown]
[im 1/3]
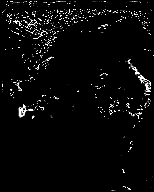

[Series 38: bSSFP · oblique · 6.0mm · 1.41mm/px · 1 of 25 slices shown (19 of 26)]
[im 1/25]
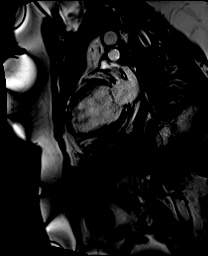

[Series 40: bSSFP · oblique · 6.0mm · 1.41mm/px · 1 of 25 slices shown (20 of 26)]
[im 1/25]
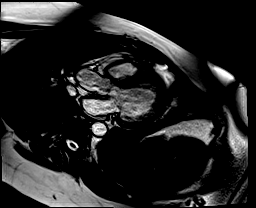

[Series 45: bSSFP · axial · 6.0mm · 1.41mm/px · 1 of 25 slices shown (21 of 26)]
[im 1/25]
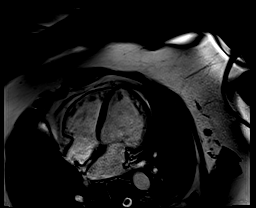

[Series 46: bSSFP · coronal · 6.0mm · 1.41mm/px · 1 of 25 slices shown (22 of 26)]
[im 1/25]
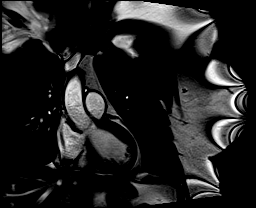

[Series 47: cine rvit · coronal · 6.0mm · 1.41mm/px · 1 of 25 slices shown]
[im 1/25]
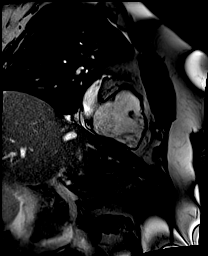

[Series 48: aortic valve cine · oblique · 6.0mm · 1.41mm/px · 1 of 25 slices shown (1 of 2)]
[im 1/25]
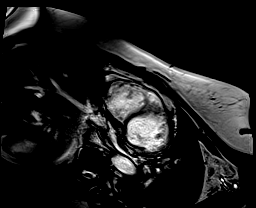

[Series 49: cine rvot · sagittal · 6.0mm · 1.41mm/px · 1 of 25 slices shown]
[im 1/25]
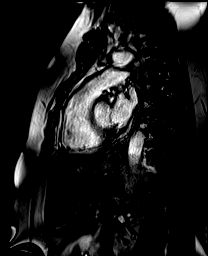

[Series 51: aortic valve cine · oblique · 6.0mm · 1.41mm/px · 1 of 25 slices shown (2 of 2)]
[im 1/25]
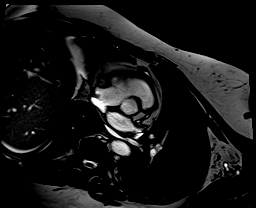

[Series 52: lge_single shot sa · oblique · 8.0mm · 1.98mm/px · 1 of 10 slices shown (1 of 2)]
[im 1/10]
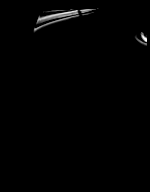

[Series 53: lge_single shot sa · oblique · 8.0mm · 1.98mm/px · 1 of 10 slices shown (2 of 2)]
[im 1/10]
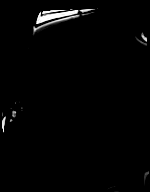

[Series 56: lge_single shot 4 · axial · 6.0mm · 1.98mm/px · 1 of 1 slices shown (1 of 2)]
[im 1/1]
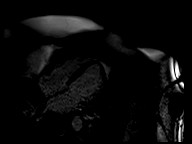

[Series 57: lge_single shot 4 · axial · 6.0mm · 1.98mm/px · 1 of 1 slices shown (2 of 2)]
[im 1/1]
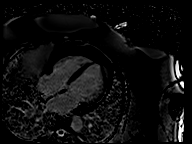

[Series 60: (id)_short_t1 · oblique · 8.0mm · 1.41mm/px · 1 of 27 slices shown]
[im 1/27]
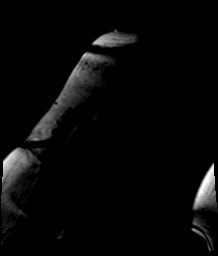

[Series 61: (id)_short_t1_moco · oblique · 8.0mm · 1.41mm/px · 1 of 27 slices shown]
[im 1/27]
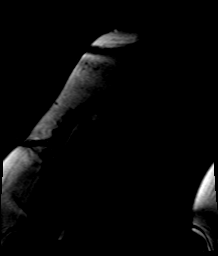

[Series 62: (id)_short_t1_moco_t1 · oblique · 8.0mm · 1.41mm/px · 1 of 4 slices shown]
[im 1/4]
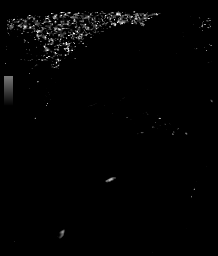

[Series 65: bSSFP · oblique · 6.0mm · 1.73mm/px · 1 of 4 slices shown (23 of 26)]
[im 1/4]
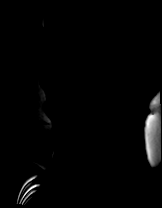

[Series 66: bSSFP · oblique · 6.0mm · 1.73mm/px · 1 of 4 slices shown (24 of 26)]
[im 1/4]
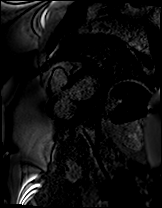

[Series 67: bSSFP · oblique · 6.0mm · 1.92mm/px · 1 of 17 slices shown (25 of 26)]
[im 1/17]
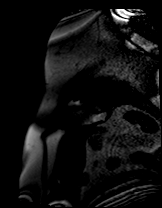

[Series 68: bSSFP · oblique · 6.0mm · 1.92mm/px · 1 of 17 slices shown (26 of 26)]
[im 1/17]
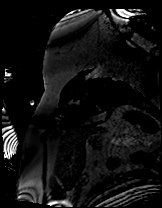

[45 of 48 positions shown; findings below may reference images not displayed]

FINDINGS: Mild LAE. Normal RA/RV. No ASD/PFO. Normal aortic root 2.7 cm. No
pericardial effusion No ELIEXSER thrombus. Normal AV tri leaflet. Normal
TV and MV.

Normal RV size and function. Normal LV size and function No discrete
RWMA. Quantitative EF 54% (EDV 124 cc, ESV 57 cc SV 66 cc) Delayed
enhancement images with gadolinium show no scar or infarct
IMPRESSION: 1. Normal LV size and function EF 54% no discrete RWMA or evidence
of previous infarct

2.  Mild LAE

3.  Normal RV size and function

4.  Normal AV, MV and TV

5.  No pericardial effusion

6.  Normal aortic root 2.7 cm

Tang Menefee

## 2022-10-26 ENCOUNTER — Other Ambulatory Visit: Payer: Self-pay | Admitting: Family

## 2022-10-26 DIAGNOSIS — Z1231 Encounter for screening mammogram for malignant neoplasm of breast: Secondary | ICD-10-CM

## 2022-11-02 ENCOUNTER — Ambulatory Visit
Admission: RE | Admit: 2022-11-02 | Discharge: 2022-11-02 | Disposition: A | Discharge status: Home / Self Care | Payer: 59 | Source: Ambulatory Visit | Attending: Family | Admitting: Family

## 2022-11-02 DIAGNOSIS — Z1231 Encounter for screening mammogram for malignant neoplasm of breast: Secondary | ICD-10-CM

## 2023-11-26 ENCOUNTER — Other Ambulatory Visit: Payer: Self-pay | Admitting: Family

## 2023-11-26 DIAGNOSIS — Z1231 Encounter for screening mammogram for malignant neoplasm of breast: Secondary | ICD-10-CM

## 2023-12-03 ENCOUNTER — Ambulatory Visit
Admission: RE | Admit: 2023-12-03 | Discharge: 2023-12-03 | Disposition: A | Discharge status: Home / Self Care | Source: Ambulatory Visit | Attending: Family | Admitting: Family

## 2023-12-03 DIAGNOSIS — Z1231 Encounter for screening mammogram for malignant neoplasm of breast: Secondary | ICD-10-CM

## 2024-08-03 ENCOUNTER — Other Ambulatory Visit (HOSPITAL_BASED_OUTPATIENT_CLINIC_OR_DEPARTMENT_OTHER): Payer: Self-pay | Admitting: Family

## 2024-08-03 DIAGNOSIS — N951 Menopausal and female climacteric states: Secondary | ICD-10-CM

## 2024-08-22 ENCOUNTER — Ambulatory Visit (INDEPENDENT_AMBULATORY_CARE_PROVIDER_SITE_OTHER)
Admission: RE | Admit: 2024-08-22 | Discharge: 2024-08-22 | Disposition: A | Discharge status: Home / Self Care | Source: Ambulatory Visit | Attending: Family | Admitting: Family

## 2024-08-22 DIAGNOSIS — N951 Menopausal and female climacteric states: Secondary | ICD-10-CM
